# Patient Record
Sex: Female | Born: 1990 | Race: Black or African American | Hispanic: No | Marital: Single | Smoking: Former smoker
Health system: Southern US, Community
[De-identification: ages and names within clinical notes are randomized; demographics above are authoritative.]

## PROBLEM LIST (undated history)

## (undated) ENCOUNTER — Inpatient Hospital Stay (HOSPITAL_COMMUNITY): Payer: Self-pay

## (undated) DIAGNOSIS — R519 Headache, unspecified: Secondary | ICD-10-CM

## (undated) DIAGNOSIS — K219 Gastro-esophageal reflux disease without esophagitis: Secondary | ICD-10-CM

## (undated) DIAGNOSIS — E669 Obesity, unspecified: Secondary | ICD-10-CM

## (undated) DIAGNOSIS — L980 Pyogenic granuloma: Secondary | ICD-10-CM

## (undated) DIAGNOSIS — J45909 Unspecified asthma, uncomplicated: Secondary | ICD-10-CM

## (undated) HISTORY — DX: Obesity, unspecified: E66.9

## (undated) HISTORY — PX: HAND TUMOR EXCISION: SUR568

## (undated) HISTORY — PX: NO PAST SURGERIES: SHX2092

---
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
## 2011-04-23 ENCOUNTER — Emergency Department (HOSPITAL_COMMUNITY): Payer: Medicaid Other

## 2011-04-23 ENCOUNTER — Emergency Department (HOSPITAL_COMMUNITY)
Admission: EM | Admit: 2011-04-23 | Discharge: 2011-04-23 | Disposition: A | Discharge status: Home / Self Care | Payer: Medicaid Other | Attending: Emergency Medicine | Admitting: Emergency Medicine

## 2011-04-23 DIAGNOSIS — R630 Anorexia: Secondary | ICD-10-CM | POA: Insufficient documentation

## 2011-04-23 DIAGNOSIS — Z79899 Other long term (current) drug therapy: Secondary | ICD-10-CM | POA: Insufficient documentation

## 2011-04-23 DIAGNOSIS — R1013 Epigastric pain: Secondary | ICD-10-CM | POA: Insufficient documentation

## 2011-04-23 DIAGNOSIS — N390 Urinary tract infection, site not specified: Secondary | ICD-10-CM | POA: Insufficient documentation

## 2011-04-23 DIAGNOSIS — R6883 Chills (without fever): Secondary | ICD-10-CM | POA: Insufficient documentation

## 2011-04-23 DIAGNOSIS — R112 Nausea with vomiting, unspecified: Secondary | ICD-10-CM | POA: Insufficient documentation

## 2011-04-23 DIAGNOSIS — K219 Gastro-esophageal reflux disease without esophagitis: Secondary | ICD-10-CM | POA: Insufficient documentation

## 2011-04-23 LAB — COMPREHENSIVE METABOLIC PANEL
AST: 20 U/L (ref 0–37)
BUN: 5 mg/dL — ABNORMAL LOW (ref 6–23)
CO2: 23 mEq/L (ref 19–32)
Calcium: 9.2 mg/dL (ref 8.4–10.5)
Chloride: 105 mEq/L (ref 96–112)
Creatinine, Ser: 0.73 mg/dL (ref 0.50–1.10)
GFR calc Af Amer: >60 mL/min (ref >60)
GFR calc non Af Amer: >60 mL/min (ref >60)
Glucose, Bld: 88 mg/dL (ref 70–99)
Total Bilirubin: 0.3 mg/dL (ref 0.3–1.2)

## 2011-04-23 LAB — URINE MICROSCOPIC-ADD ON

## 2011-04-23 LAB — DIFFERENTIAL
Eosinophils Absolute: 0.2 10*3/uL (ref 0.0–0.7)
Lymphocytes Relative: 26 % (ref 12–46)
Lymphs Abs: 2 10*3/uL (ref 0.7–4.0)
Monocytes Relative: 9 % (ref 3–12)
Neutrophils Relative %: 63 % (ref 43–77)

## 2011-04-23 LAB — URINALYSIS, ROUTINE W REFLEX MICROSCOPIC
Nitrite: NEGATIVE
Protein, ur: NEGATIVE mg/dL
Specific Gravity, Urine: 1.019 (ref 1.005–1.030)
Urobilinogen, UA: 1 mg/dL (ref 0.0–1.0)

## 2011-04-23 LAB — LIPASE, BLOOD: Lipase: 26 U/L (ref 11–59)

## 2011-04-23 LAB — CBC
HCT: 37.1 % (ref 36.0–46.0)
MCH: 27.6 pg (ref 26.0–34.0)
MCV: 80.7 fL (ref 78.0–100.0)
Platelets: 334 10*3/uL (ref 150–400)
RBC: 4.6 MIL/uL (ref 3.87–5.11)
WBC: 8 10*3/uL (ref 4.0–10.5)

## 2011-04-23 LAB — POCT PREGNANCY, URINE: Preg Test, Ur: NEGATIVE

## 2011-12-12 IMAGING — CT CT ABD-PELV W/O CM
2 of 4 series · 17 of 46 positions shown, 19 images · non-contrast
Comparison: None.

CLINICAL DATA: Epigastric and suprapubic abdominal pain radiating
to the right back for the past 4 days.  Nausea, vomiting and
diarrhea.  Polyuria.

CT ABDOMEN AND PELVIS WITHOUT CONTRAST
TECHNIQUE: Multidetector CT imaging of the abdomen and pelvis was
performed following the standard protocol without intravenous
contrast.

[Series 2: stone under 200# w/ prev · axial · 0.79mm/px · z∈[-498,-118]mm · 14 of 84 slices shown, 16 images]
[im 4/84  soft-tissue]
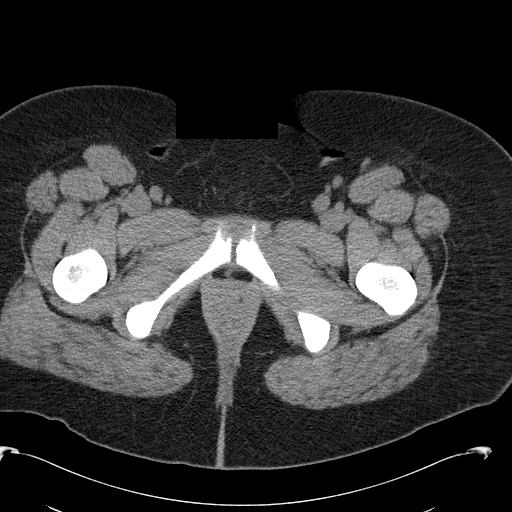
[im 4/84  bone]
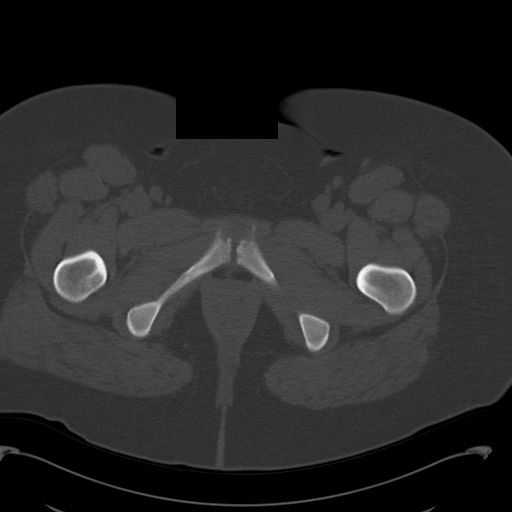
[im 11/84  soft-tissue]
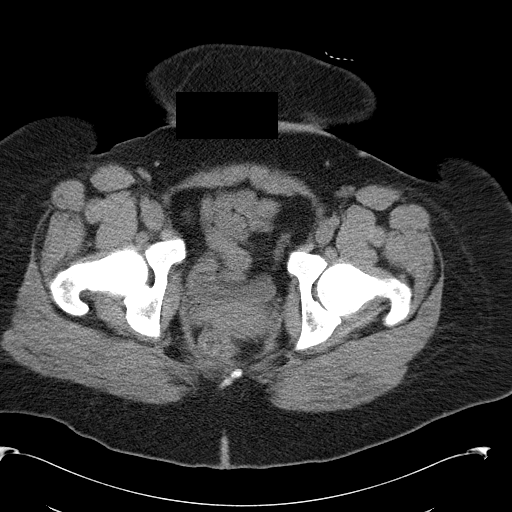
[im 18/84  soft-tissue]
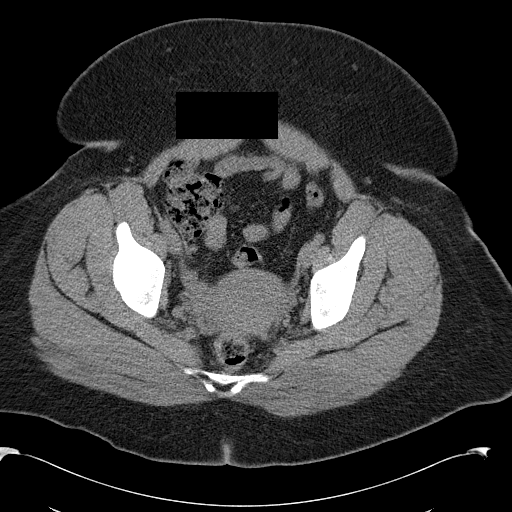
[im 21/84  soft-tissue]
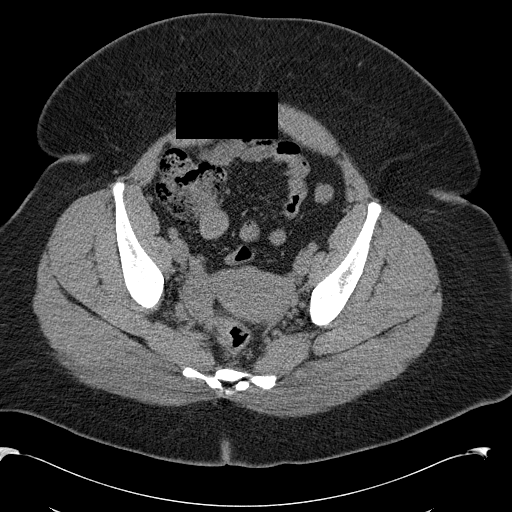
[im 28/84  soft-tissue]
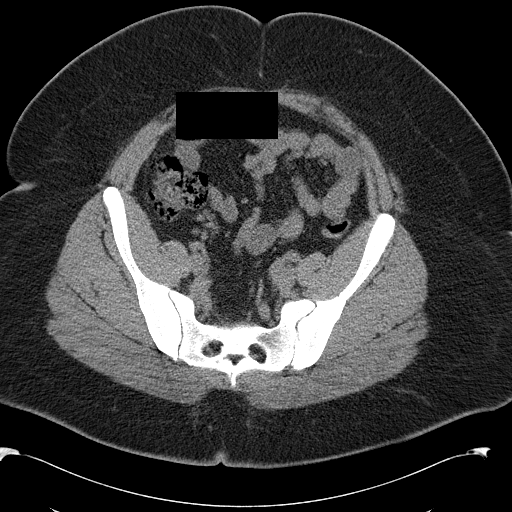
[im 35/84  soft-tissue]
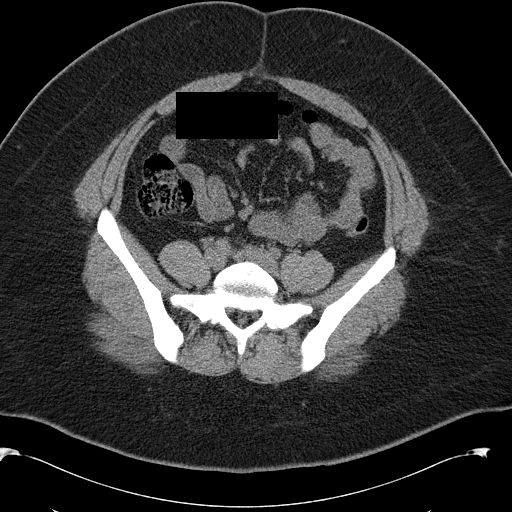
[im 39/84  soft-tissue]
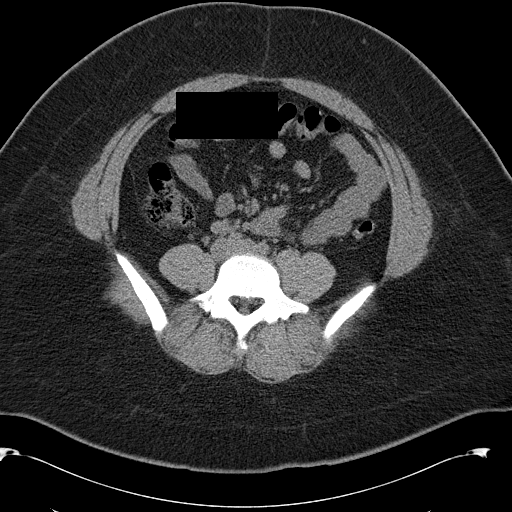
[im 45/84  soft-tissue]
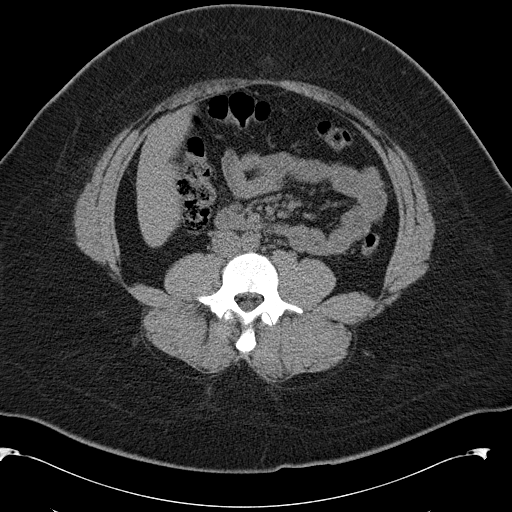
[im 49/84  soft-tissue]
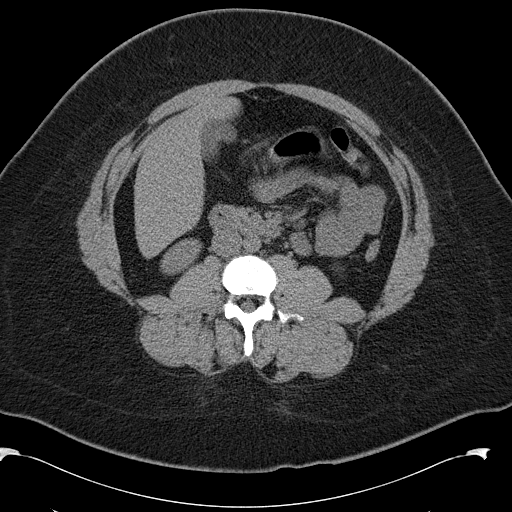
[im 49/84  bone]
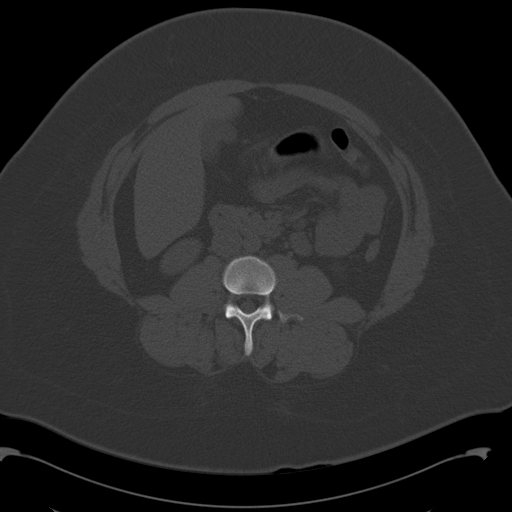
[im 56/84  soft-tissue]
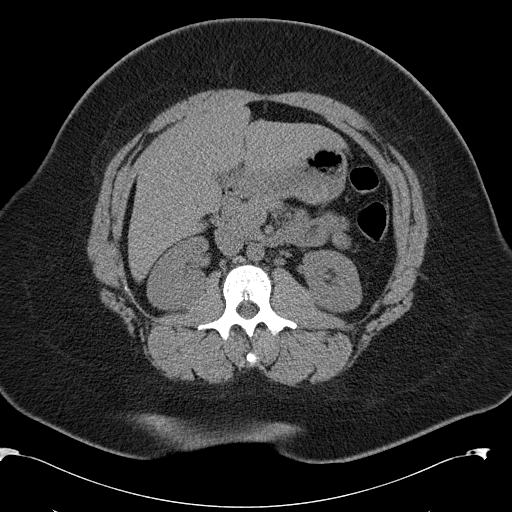
[im 63/84  soft-tissue]
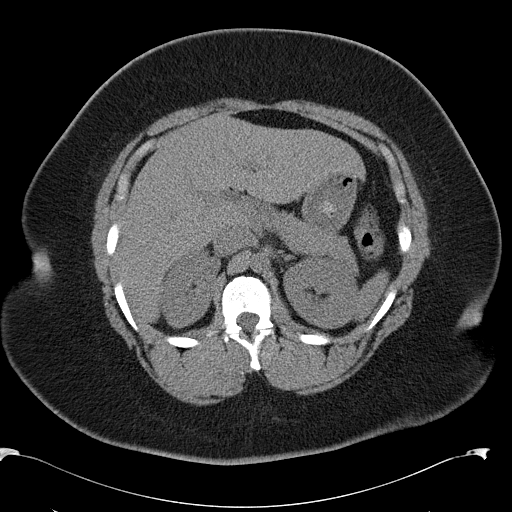
[im 66/84  soft-tissue]
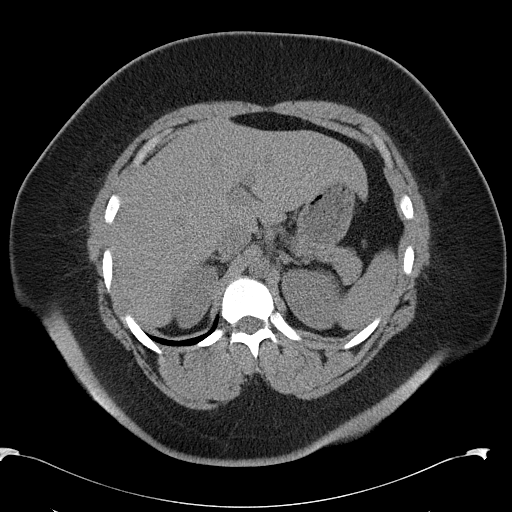
[im 73/84  soft-tissue]
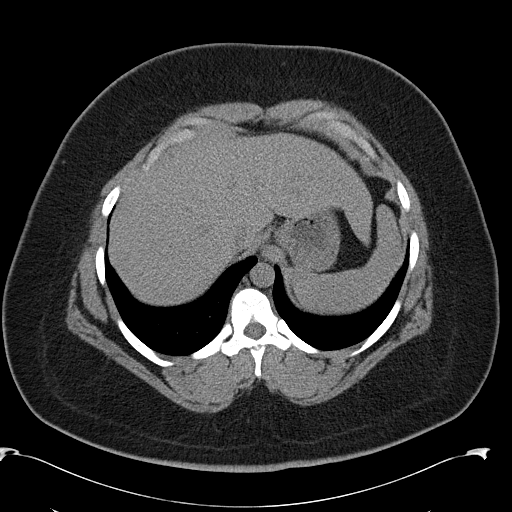
[im 80/84  soft-tissue]
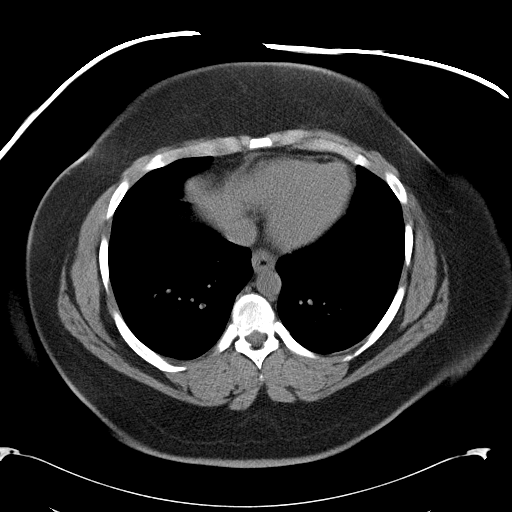

[Series 602: coronal abdomen · coronal · 0.85mm/px · 3 of 172 slices shown]
[im 58/172  soft-tissue]
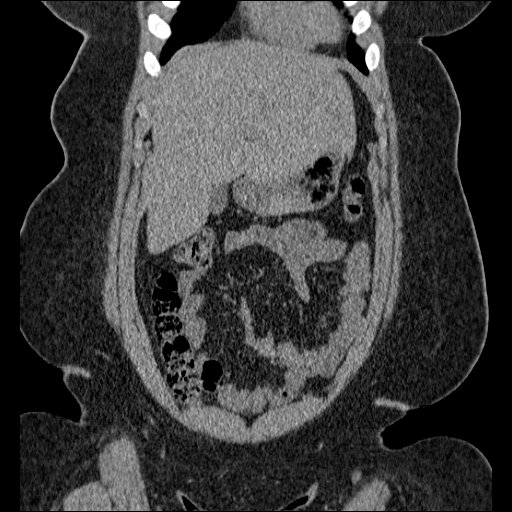
[im 77/172  soft-tissue]
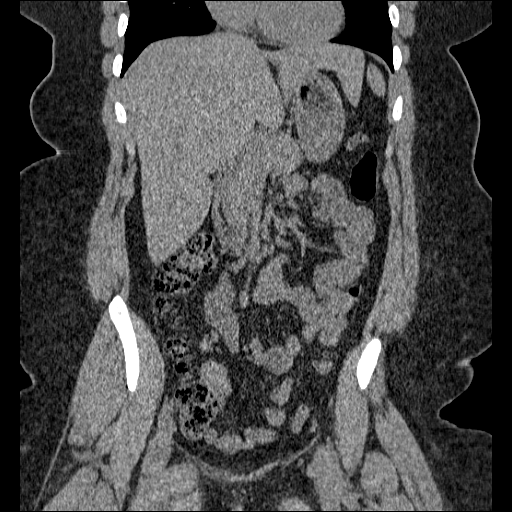
[im 96/172  soft-tissue]
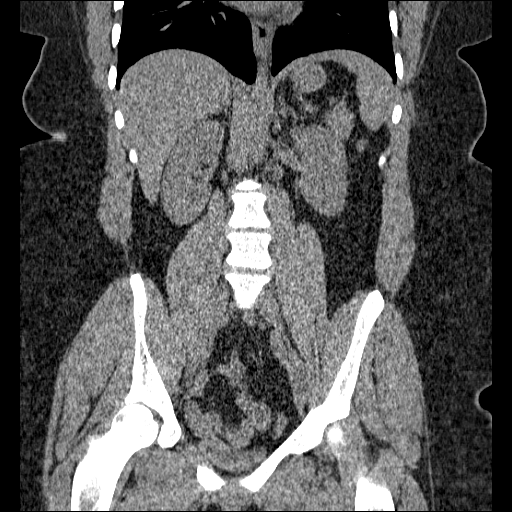

[17 of 46 positions shown; findings below may reference images not displayed]

FINDINGS: Small hiatal hernia or prominent esophageal ampulla.
Clear lung bases.  Normal appearing noncontrasted appearance of the
liver, spleen, pancreas, gallbladder, adrenal glands, kidneys,
urinary bladder, uterus and ovaries.  Mildly prominent mesenteric
lymph nodes with no abnormally enlarged lymph nodes.  No
gastrointestinal abnormalities seen.  No evidence of appendicitis.
No free peritoneal fluid or air.  Normal appearing bones.
IMPRESSION: Small hiatal hernia or prominent esophageal ampulla.  Otherwise,
normal examination.

## 2012-05-02 ENCOUNTER — Encounter (HOSPITAL_COMMUNITY): Payer: Self-pay | Admitting: Emergency Medicine

## 2012-05-02 ENCOUNTER — Emergency Department (HOSPITAL_COMMUNITY)
Admission: EM | Admit: 2012-05-02 | Discharge: 2012-05-03 | Disposition: A | Discharge status: Home / Self Care | Payer: Medicaid Other | Attending: Emergency Medicine | Admitting: Emergency Medicine

## 2012-05-02 DIAGNOSIS — S239XXA Sprain of unspecified parts of thorax, initial encounter: Secondary | ICD-10-CM | POA: Insufficient documentation

## 2012-05-02 DIAGNOSIS — M549 Dorsalgia, unspecified: Secondary | ICD-10-CM

## 2012-05-02 DIAGNOSIS — F17200 Nicotine dependence, unspecified, uncomplicated: Secondary | ICD-10-CM | POA: Insufficient documentation

## 2012-05-02 DIAGNOSIS — T148XXA Other injury of unspecified body region, initial encounter: Secondary | ICD-10-CM

## 2012-05-02 DIAGNOSIS — X58XXXA Exposure to other specified factors, initial encounter: Secondary | ICD-10-CM | POA: Insufficient documentation

## 2012-05-02 DIAGNOSIS — J45909 Unspecified asthma, uncomplicated: Secondary | ICD-10-CM | POA: Insufficient documentation

## 2012-05-02 HISTORY — DX: Unspecified asthma, uncomplicated: J45.909

## 2012-05-02 NOTE — ED Notes (Signed)
Pt states a week ago she was helping her mother move furniture and she fell and since then she has been having pain in her middle of her back  Pt states with certain movement it sends shocklike waves through her back  Pt states she has been taking ibuprofen 800mg  for the past couple days without relief

## 2012-05-03 ENCOUNTER — Emergency Department (HOSPITAL_COMMUNITY): Payer: Medicaid Other

## 2012-05-03 MED ORDER — CYCLOBENZAPRINE HCL 10 MG PO TABS: 10.0000 mg | ORAL_TABLET | Freq: Three times a day (TID) | ORAL | Status: AC | PRN

## 2012-05-03 MED ORDER — KETOROLAC TROMETHAMINE 60 MG/2ML IM SOLN
60.0000 mg | Freq: Once | INTRAMUSCULAR | Status: AC
  Administered 2012-05-03: 60 mg via INTRAMUSCULAR
  Filled 2012-05-03: qty 2

## 2012-05-03 MED ORDER — HYDROCODONE-ACETAMINOPHEN 5-325 MG PO TABS: 1.0000 | ORAL_TABLET | ORAL | Status: AC | PRN

## 2012-05-03 NOTE — ED Notes (Addendum)
Pt c/o itching from the medication, but denies being SOB.

## 2012-05-03 NOTE — ED Provider Notes (Signed)
History     CSN: [id number]  Arrival date & time 05/02/12  2336   First MD Initiated Contact with Patient 05/03/12 0144      Chief Complaint  Patient presents with  . Back Pain   HPI  History provided by the patient. Patient is a 21 year old female with history of asthma who presents with complaints of mid back pain. Patient reports having persistent pains for the past week. Patient states that she was helping her mother move furniture last week and was helping. Couch when she tripped and fell backwards landing directly on her back. Sometimes she has had waxing waning severe pains to the mid back area. Pain is sharp at times and sometimes described as electric shocklike in her back area. Pain does not radiate significantly. She denies any pain in her lower extremities or upper extremities. She denies any numbness or weakness in extremities. Denies any urinary or fecal incontinence, urinary retention or perineal numbness. Patient has been using ibuprofen with minimal relief. Patient also use heating pad with slight improvements. Pain is worse with walking and movements. Denies any chest pain, pleuritic pain or shortness of breath.    Past Medical History  Diagnosis Date  . Asthma   . Recurrent sinus infections     History reviewed. No pertinent past surgical history.  Family History  Problem Relation Age of Onset  . Hypertension Other   . Cancer Other     History  Substance Use Topics  . Smoking status: Current Everyday Smoker    Types: Cigarettes  . Smokeless tobacco: Not on file  . Alcohol Use: No    OB History    Grav Para Term Preterm Abortions TAB SAB Ect Mult Living                  Review of Systems  HENT: Negative for neck pain.   Respiratory: Negative for shortness of breath.   Cardiovascular: Negative for chest pain.  Gastrointestinal: Negative for nausea, vomiting and abdominal pain.  Genitourinary: Negative for dysuria, frequency, hematuria and flank  pain.  Musculoskeletal: Negative for back pain.  Neurological: Negative for weakness, numbness and headaches.    Allergies  Review of patient's allergies indicates no known allergies.  Home Medications   Current Outpatient Rx  Name Route Sig Dispense Refill  . IBUPROFEN 800 MG PO TABS Oral Take 800 mg by mouth every 8 (eight) hours as needed. Back pain    . NORETHINDRON-ETHINYL ESTRAD-FE 1-20/1-30/1-35 MG-MCG PO TABS Oral Take 1 tablet by mouth daily.      BP 110/65  Pulse 89  Temp 98.3 F (36.8 C) (Oral)  Resp 18  Ht 5\' 4"  (1.626 m)  SpO2 99%  LMP 05/01/2012  Physical Exam  Nursing note and vitals reviewed. Constitutional: She is oriented to person, place, and time. She appears well-developed and well-nourished. No distress.       Obese  HENT:  Head: Normocephalic and atraumatic.  Neck: Normal range of motion. Neck supple.  Cardiovascular: Normal rate and regular rhythm.   Pulmonary/Chest: Effort normal and breath sounds normal.  Musculoskeletal:       Cervical back: Normal.       Thoracic back: She exhibits tenderness.       Lumbar back: Normal.       Back:  Neurological: She is alert and oriented to person, place, and time. She has normal strength. No sensory deficit. Gait normal.  Skin: Skin is warm and dry. No erythema.  Psychiatric: She has a normal mood and affect. Her behavior is normal.    ED Course  Procedures   Dg Thoracic Spine 2 View  05/03/2012  *RADIOLOGY REPORT*  Clinical Data: Back pain.  THORACIC SPINE - 2 VIEW  Comparison: None.  Findings: There is a dextroconvex curvature of the thoracolumbar junction.  Vertebral body height preserved.  Paraspinal lines appear within normal limits.  No compression fractures identified. Cervicothoracic junction appears within normal limits.  IMPRESSION: No acute osseous abnormality.  Original Report Authenticated By: Andreas Newport, M.D.     1. Muscle strain   2. Back pain       MDM  1:50 AM patient seen  and evaluated.   X-rays unremarkable. At this time suspect muscle strain. Patient given prescriptions for muscle relaxer.     Angus Seller, [place] 05/03/12 2139

## 2012-05-03 NOTE — ED Notes (Signed)
Pt reports mid back pain for a week. Pt reports taking 800 Ibuprofen but to no relief. Pt reports helping her mom move and was lifting heavy furniture a week ago.

## 2012-05-04 NOTE — ED Provider Notes (Signed)
Medical screening examination/treatment/procedure(s) were performed by non-physician practitioner and as supervising physician I was immediately available for consultation/collaboration.  Kelin Borum M Briar Witherspoon, MD 05/04/12 0715 

## 2012-06-23 ENCOUNTER — Emergency Department (HOSPITAL_COMMUNITY)
Admission: EM | Admit: 2012-06-23 | Discharge: 2012-06-24 | Disposition: A | Discharge status: Home / Self Care | Payer: Medicaid Other | Attending: Emergency Medicine | Admitting: Emergency Medicine

## 2012-06-23 ENCOUNTER — Emergency Department (HOSPITAL_COMMUNITY): Payer: Medicaid Other

## 2012-06-23 ENCOUNTER — Encounter (HOSPITAL_COMMUNITY): Payer: Self-pay

## 2012-06-23 DIAGNOSIS — J329 Chronic sinusitis, unspecified: Secondary | ICD-10-CM | POA: Insufficient documentation

## 2012-06-23 DIAGNOSIS — F17200 Nicotine dependence, unspecified, uncomplicated: Secondary | ICD-10-CM | POA: Insufficient documentation

## 2012-06-23 DIAGNOSIS — J45909 Unspecified asthma, uncomplicated: Secondary | ICD-10-CM | POA: Insufficient documentation

## 2012-06-23 DIAGNOSIS — J069 Acute upper respiratory infection, unspecified: Secondary | ICD-10-CM | POA: Insufficient documentation

## 2012-06-23 NOTE — ED Notes (Signed)
Per pt started having n/v, headache, cough x 2 days ago.  No fevers noted at home.  Pt states coughing yellow phlegm.  Runny nose.

## 2012-06-24 LAB — CBC WITH DIFFERENTIAL/PLATELET
Eosinophils Absolute: 0.2 10*3/uL (ref 0.0–0.7)
Eosinophils Relative: 2 % (ref 0–5)
HCT: 40.7 % (ref 36.0–46.0)
Hemoglobin: 14 g/dL (ref 12.0–15.0)
Lymphs Abs: 3.2 10*3/uL (ref 0.7–4.0)
MCH: 28.5 pg (ref 26.0–34.0)
MCV: 82.9 fL (ref 78.0–100.0)
Monocytes Absolute: 1 10*3/uL (ref 0.1–1.0)
Monocytes Relative: 9 % (ref 3–12)
Platelets: 263 10*3/uL (ref 150–400)
RBC: 4.91 MIL/uL (ref 3.87–5.11)

## 2012-06-24 LAB — URINALYSIS, ROUTINE W REFLEX MICROSCOPIC
Bilirubin Urine: NEGATIVE
Glucose, UA: NEGATIVE mg/dL
Hgb urine dipstick: NEGATIVE
Specific Gravity, Urine: 1.031 — ABNORMAL HIGH (ref 1.005–1.030)
Urobilinogen, UA: 1 mg/dL (ref 0.0–1.0)

## 2012-06-24 LAB — COMPREHENSIVE METABOLIC PANEL
BUN: 14 mg/dL (ref 6–23)
CO2: 26 mEq/L (ref 19–32)
Calcium: 9.3 mg/dL (ref 8.4–10.5)
Creatinine, Ser: 0.93 mg/dL (ref 0.50–1.10)
GFR calc Af Amer: >90 mL/min (ref >90)
GFR calc non Af Amer: 88 mL/min — ABNORMAL LOW (ref >90)
Glucose, Bld: 87 mg/dL (ref 70–99)
Total Protein: 7.2 g/dL (ref 6.0–8.3)

## 2012-06-24 LAB — URINE MICROSCOPIC-ADD ON

## 2012-06-24 MED ORDER — HYDROCODONE-ACETAMINOPHEN 7.5-500 MG/15ML PO SOLN: 15.0000 mL | Freq: Four times a day (QID) | ORAL | Status: DC | PRN

## 2012-06-24 MED ORDER — HYDROCODONE-ACETAMINOPHEN 7.5-500 MG/15ML PO SOLN
15.0000 mL | Freq: Once | ORAL | Status: AC
  Administered 2012-06-24: 15 mL via ORAL
  Filled 2012-06-24: qty 15

## 2012-06-24 MED ORDER — GUAIFENESIN 100 MG/5ML PO LIQD: 100.0000 mg | ORAL | Status: DC | PRN

## 2012-06-24 NOTE — ED Provider Notes (Signed)
History     CSN: [id number]  Arrival date & time 06/23/12  2221   First MD Initiated Contact with Patient 06/24/12 0023      Chief Complaint  Patient presents with  . Emesis  . Cough  . Headache    (Consider location/radiation/quality/duration/timing/severity/associated sxs/prior treatment) HPI  21 year old female presents with URI sxs.  Patient reports for the past 2 days she has had cold symptoms including, sinus congestion, sneezing, coughing, sore throat, chest congestion, and body aches.  She is coughing up yellow phlegm.  Onset is gradual, persistent, and worsening.  Has postussive emesis.  She denies fever, chills, cp, sob, hemoptysis, abd pain, urinary sxs or rash.  She is a smoker.  Has hx of asthma but has not need to use inhaler.    Past Medical History  Diagnosis Date  . Asthma   . Recurrent sinus infections     History reviewed. No pertinent past surgical history.  Family History  Problem Relation Age of Onset  . Hypertension Other   . Cancer Other     History  Substance Use Topics  . Smoking status: Current Every Day Smoker    Types: Cigarettes  . Smokeless tobacco: Not on file  . Alcohol Use: No    OB History    Grav Para Term Preterm Abortions TAB SAB Ect Mult Living                  Review of Systems  All other systems reviewed and are negative.    Allergies  Review of patient's allergies indicates no known allergies.  Home Medications   Current Outpatient Rx  Name Route Sig Dispense Refill  . LORATADINE 10 MG PO TABS Oral Take 10 mg by mouth daily.      BP 120/72  Pulse 99  Temp 98.9 F (37.2 C) (Oral)  Resp 20  SpO2 100%  LMP 05/25/2012  Physical Exam  Nursing note and vitals reviewed. Constitutional: She appears well-developed and well-nourished.       Persistent cough, and appears uncomfortable  HENT:  Head: Normocephalic and atraumatic.  Right Ear: External ear normal.  Left Ear: External ear normal.       Mild post  oropharyngeal erythema without signs of deep infection such as PTA or Ludwig's angina.  Uvula midline  Eyes: Conjunctivae normal are normal. Right eye exhibits no discharge. Left eye exhibits no discharge.  Neck: Normal range of motion. Neck supple.  Cardiovascular: Normal rate and regular rhythm.   Pulmonary/Chest: Effort normal and breath sounds normal. No respiratory distress. She exhibits no tenderness.  Abdominal: Soft. Bowel sounds are normal. There is no tenderness.  Lymphadenopathy:    She has no cervical adenopathy.  Neurological: She is alert.  Skin: Skin is warm. No rash noted.  Psychiatric: She has a normal mood and affect.    ED Course  Procedures (including critical care time)  Labs Reviewed - No data to display Dg Chest 2 View  06/23/2012  *RADIOLOGY REPORT*  Clinical Data: Cough and headache.  CHEST - 2 VIEW  Comparison: None.  Findings: The lungs are clear without focal consolidation, edema, effusion or pneumothorax.  Cardiopericardial silhouette is within normal limits for size.  Imaged bony structures of the thorax are intact.  IMPRESSION: Normal cardiopulmonary exam.   Original Report Authenticated By: ERIC A. MANSELL, M.D.    1. URI  MDM  Pt presents with URI sxs.  She is afebrile, cxr neg for pna.  VSS.  Will give lortab elixir for cough and pain.  Reassurance given.  Care instruction given.     BP 120/72  Pulse 99  Temp 98.9 F (37.2 C) (Oral)  Resp 20  SpO2 100%  LMP 05/25/2012  Nursing notes reviewed and considered in documentation  Previous records reviewed and considered  All labs/vitals reviewed and considered  xrays reviewed and considered      Fayrene Helper, PA-C 06/24/12 0105

## 2012-06-24 NOTE — ED Provider Notes (Signed)
Medical screening examination/treatment/procedure(s) were performed by non-physician practitioner and as supervising physician I was immediately available for consultation/collaboration. Katharine Rochefort, MD, FACEP   Raden Byington L Donnielle Addison, MD 06/24/12 0114 

## 2012-09-01 IMAGING — US US ABDOMEN COMPLETE
1 series · 14 of 25 positions shown · non-contrast
Comparison: None.

CLINICAL DATA: Abdominal pain

ABDOMINAL ULTRASOUND COMPLETE

[Series 1: us abdomen complete · 0.31mm/px · 14 of 77 slices shown]
[im 1/77]
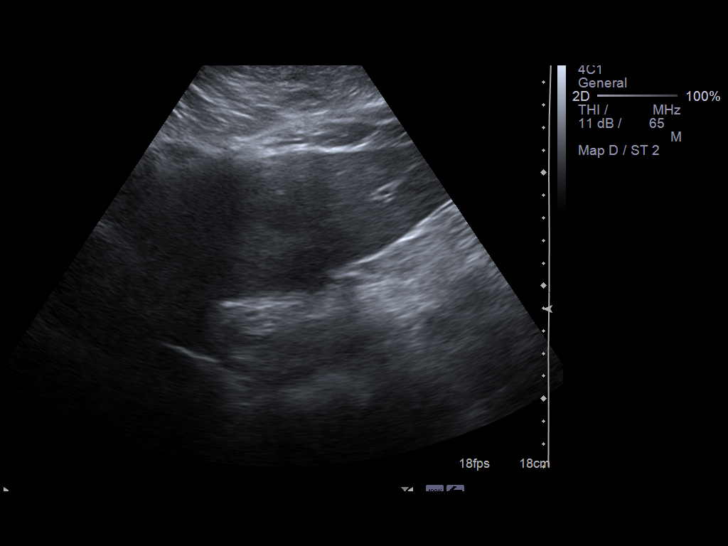
[im 7/77]
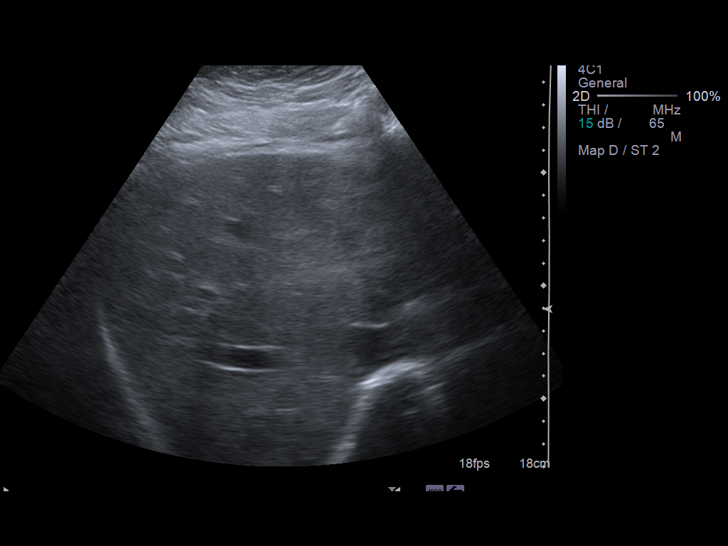
[im 13/77]
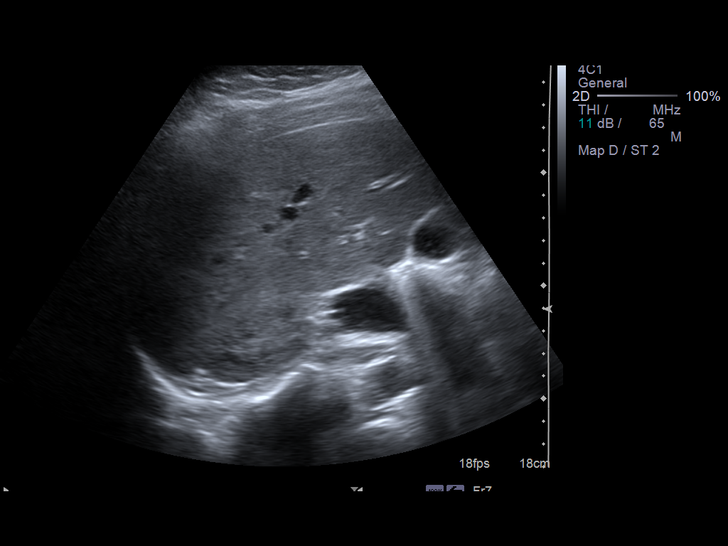
[im 20/77]
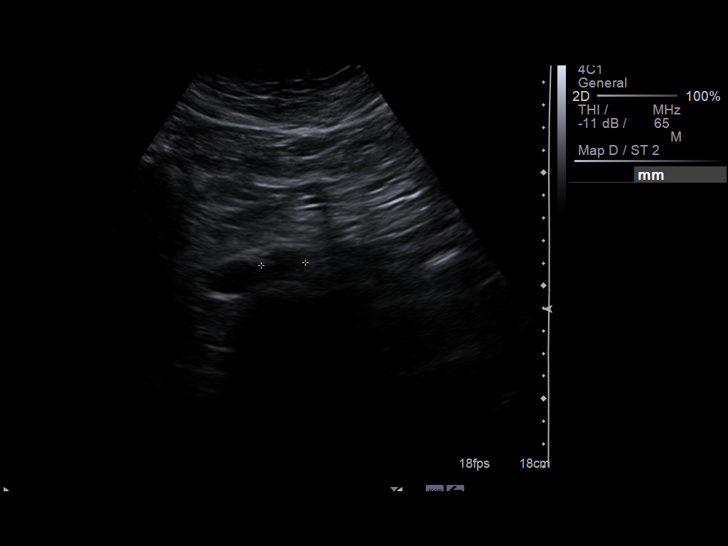
[im 26/77]
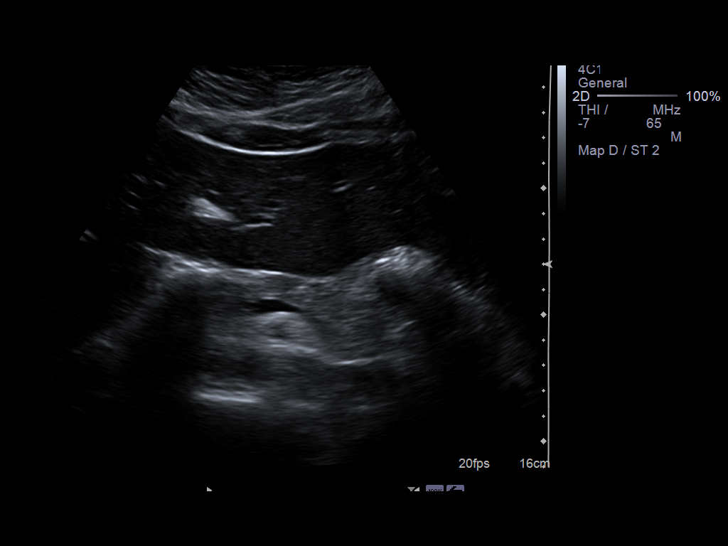
[im 29/77]
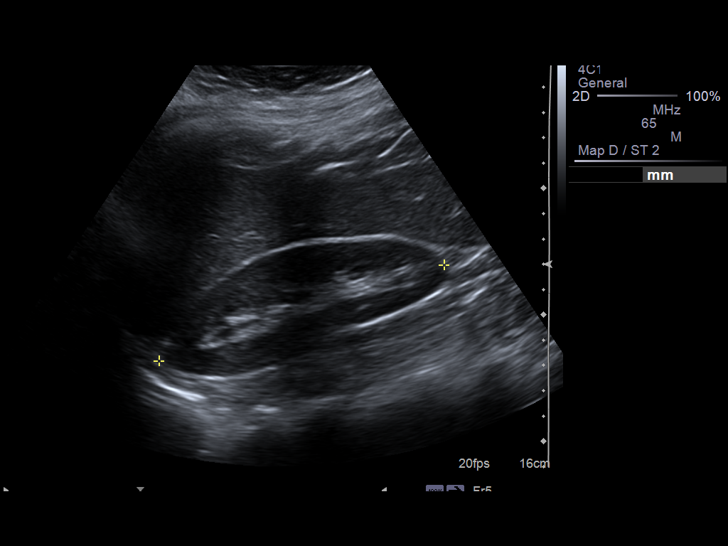
[im 35/77]
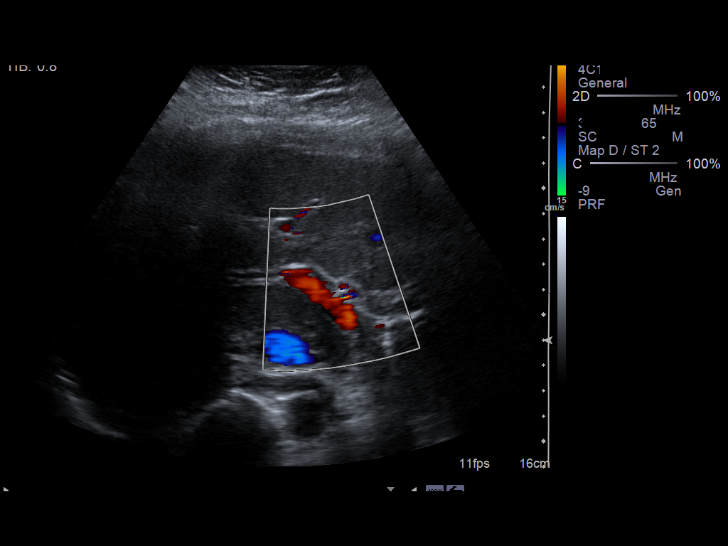
[im 42/77]
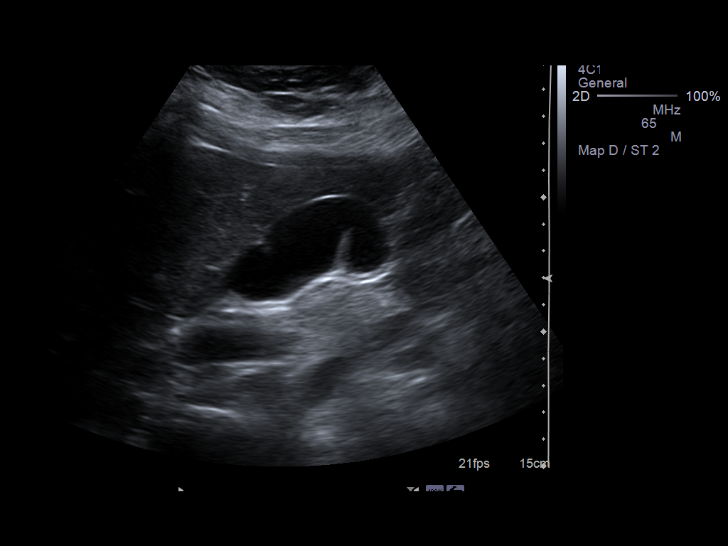
[im 48/77]
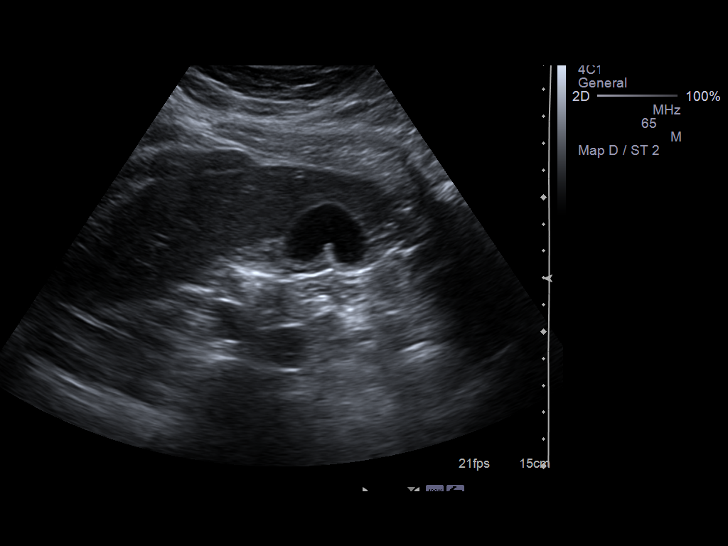
[im 51/77]
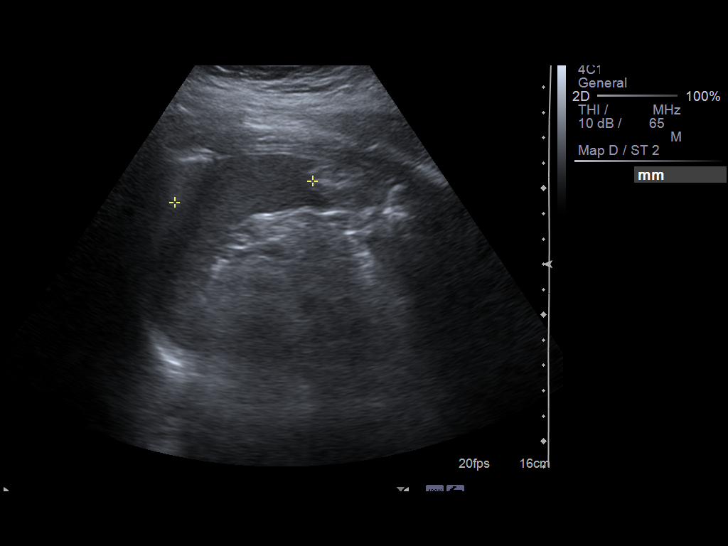
[im 58/77]
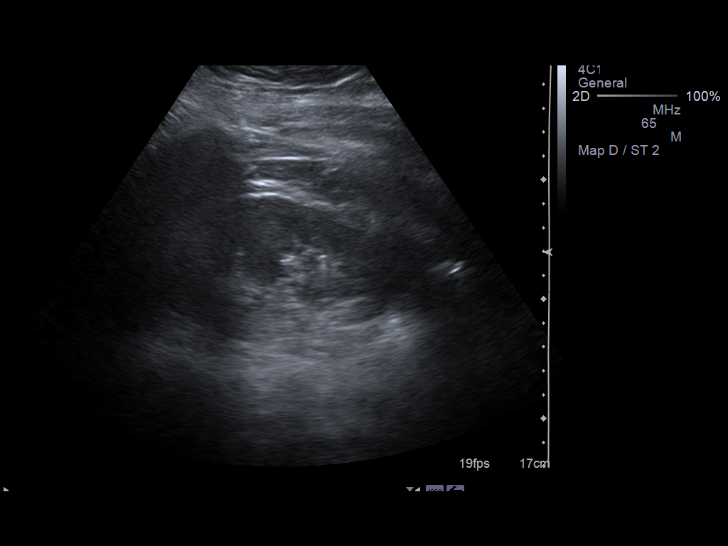
[im 64/77]
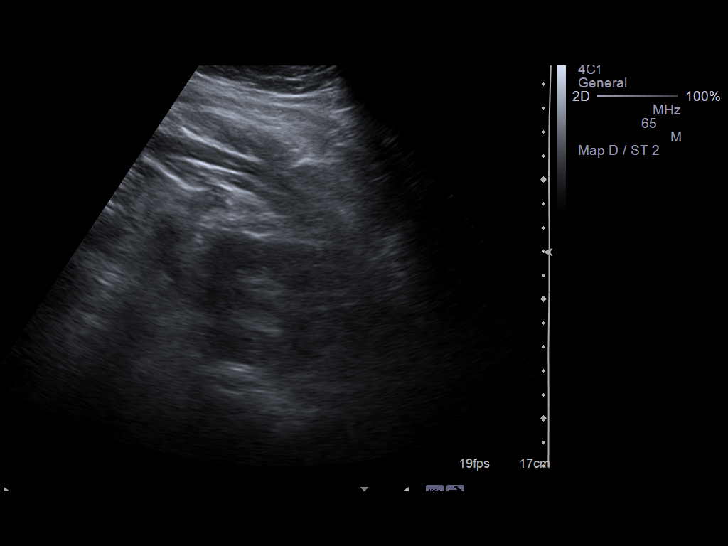
[im 70/77]
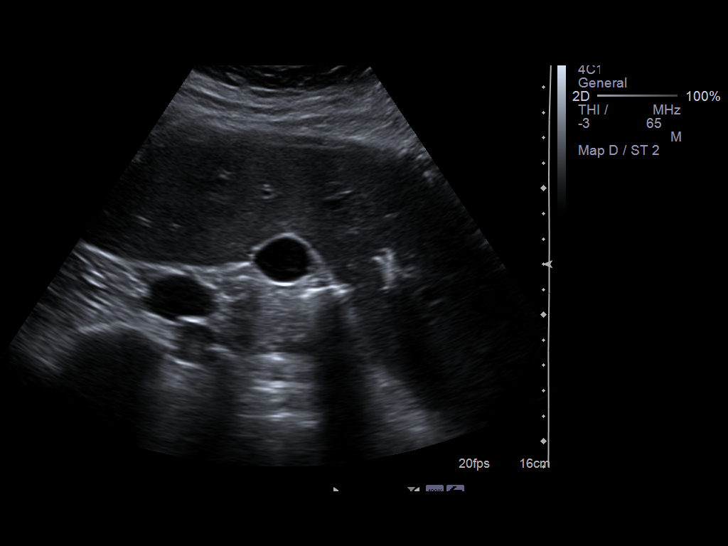
[im 77/77]
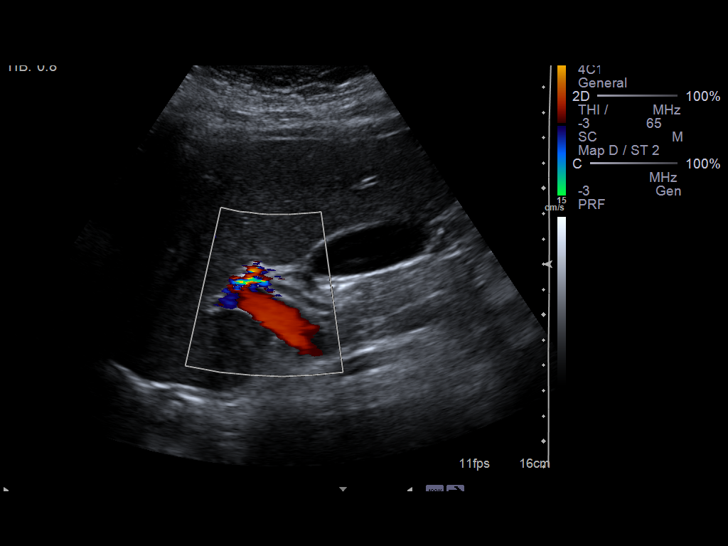

[14 of 25 positions shown; findings below may reference images not displayed]

FINDINGS: Gallbladder:  No gallstones, gallbladder wall thickening, or
pericholecystic fluid.

Common Bile Duct:  Within normal limits in caliber.

Liver: No focal mass lesion identified.  Within normal limits in
parenchymal echogenicity.

IVC:  Appears normal.

Pancreas:  No abnormality identified.

Spleen:  Within normal limits in size and echotexture.

Right kidney:  Normal in size and parenchymal echogenicity.  No
evidence of mass or hydronephrosis.

Left kidney:  Normal in size and parenchymal echogenicity.  No
evidence of mass or hydronephrosis.

Abdominal Aorta:  No aneurysm identified.
IMPRESSION: Negative abdominal ultrasound.

## 2012-09-21 NOTE — L&D Delivery Note (Signed)
Operative Delivery Note At 3:52 PM a viable female was delivered via Vaginal, Spontaneous Delivery.    Verbal consent: obtained from patient.  Risks and benefits discussed in detail.  Risks include, but are not limited to the risks of anesthesia, bleeding, infection, damage to maternal tissues, fetal cephalhematoma.  There is also the risk of inability to effect vaginal delivery of the head, or shoulder dystocia that cannot be resolved by established maneuvers, leading to the need for emergency cesarean section.  APGAR: 9, 9; weight 8 lb 14.9 oz (4051 g).   Placenta status: Intact, Spontaneous.   Cord: 3 vessels with the following complications: None.    Anesthesia: Epidural  Instruments: KIWI vacuum Episiotomy: None Lacerations: 2nd degree;Perineal Suture Repair: 3.0 vicryl Est. Blood Loss (mL): 400  Mom to postpartum.  Baby to nursery-stable.  Called to pt room for maternal exhaustion after pushing for >2 hours. Risks of vacuum discussed as above. Pt pushed to deliver a newborn female via VAVD. No popoffs. Spontaneous cry.  2nd degree perineal repaired with 3-0 vicryl. No complications. Placenta delivered spontaneously intact.   Dr. Marice Potter present for delivery  Leslie Mayer 05/20/2013, 4:01 AM

## 2012-10-05 ENCOUNTER — Encounter (HOSPITAL_COMMUNITY): Payer: Self-pay | Admitting: *Deleted

## 2012-10-05 ENCOUNTER — Inpatient Hospital Stay (HOSPITAL_COMMUNITY)
Admission: AD | Admit: 2012-10-05 | Discharge: 2012-10-05 | Disposition: A | Discharge status: Home / Self Care | Payer: Medicaid Other | Source: Ambulatory Visit | Attending: Obstetrics & Gynecology | Admitting: Obstetrics & Gynecology

## 2012-10-05 DIAGNOSIS — O210 Mild hyperemesis gravidarum: Secondary | ICD-10-CM

## 2012-10-05 DIAGNOSIS — O219 Vomiting of pregnancy, unspecified: Secondary | ICD-10-CM

## 2012-10-05 LAB — URINALYSIS, ROUTINE W REFLEX MICROSCOPIC
Glucose, UA: NEGATIVE mg/dL
Leukocytes, UA: NEGATIVE
Protein, ur: NEGATIVE mg/dL
Specific Gravity, Urine: >1.03 — ABNORMAL HIGH (ref 1.005–1.030)

## 2012-10-05 LAB — POCT PREGNANCY, URINE: Preg Test, Ur: POSITIVE — AB

## 2012-10-05 MED ORDER — DOXYLAMINE-PYRIDOXINE 10-10 MG PO TBEC: 1.0000 | DELAYED_RELEASE_TABLET | Freq: Two times a day (BID) | ORAL | Status: DC

## 2012-10-05 MED ORDER — ONDANSETRON 8 MG PO TBDP
8.0000 mg | ORAL_TABLET | Freq: Once | ORAL | Status: AC
  Administered 2012-10-05: 8 mg via ORAL
  Filled 2012-10-05: qty 1

## 2012-10-05 MED ORDER — ONDANSETRON 8 MG PO TBDP: 8.0000 mg | ORAL_TABLET | Freq: Three times a day (TID) | ORAL | Status: DC | PRN

## 2012-10-05 NOTE — MAU Provider Note (Signed)
History     CSN: [id number]  Arrival date and time: 10/05/12 1133   None     Chief Complaint  Patient presents with  . Nausea  . Emesis  . Diarrhea   HPI 22 y.o. G1P0 at [redacted]w[redacted]d with n/v x 3 days, loose stool x 3 today. No fever/chills. Patient's last menstrual period was 08/20/2012.  Past Medical History  Diagnosis Date  . Asthma   . Recurrent sinus infections   . Heart murmur     Past Surgical History  Procedure Date  . No past surgeries     Family History  Problem Relation Age of Onset  . Hypertension Other   . Cancer Other     History  Substance Use Topics  . Smoking status: Current Every Day Smoker -- 1.0 packs/day    Types: Cigarettes  . Smokeless tobacco: Never Used  . Alcohol Use: No    Allergies: No Known Allergies  No prescriptions prior to admission    Review of Systems  Constitutional: Negative.   Respiratory: Negative.   Cardiovascular: Negative.   Gastrointestinal: Negative for nausea, vomiting, abdominal pain, diarrhea and constipation.  Genitourinary: Negative for dysuria, urgency, frequency, hematuria and flank pain.       Negative for vaginal bleeding, vaginal discharge  Musculoskeletal: Negative.   Neurological: Negative.   Psychiatric/Behavioral: Negative.    Physical Exam   Blood pressure 117/67, pulse 80, temperature 97 F (36.1 C), temperature source Oral, resp. rate 18, height 5\' 4"  (1.626 m), weight 283 lb (128.368 kg), last menstrual period 08/20/2012.  Physical Exam  Nursing note and vitals reviewed. Constitutional: She is oriented to person, place, and time. She appears well-developed and well-nourished. No distress.  Cardiovascular: Normal rate.   Respiratory: Effort normal.  GI: Soft. She exhibits no distension and no mass. There is no tenderness. There is no rebound and no guarding.  Musculoskeletal: Normal range of motion.  Neurological: She is alert and oriented to person, place, and time.  Skin: Skin is warm and  dry.  Psychiatric: She has a normal mood and affect.    MAU Course  Procedures  Results for orders placed during the hospital encounter of 10/05/12 (from the past 24 hour(s))  URINALYSIS, ROUTINE W REFLEX MICROSCOPIC     Status: Abnormal   Collection Time   10/05/12 12:29 PM      Component Value Range   Color, Urine YELLOW  YELLOW   APPearance CLEAR  CLEAR   Specific Gravity, Urine >1.030 (*) 1.005 - 1.030   pH 6.0  5.0 - 8.0   Glucose, UA NEGATIVE  NEGATIVE mg/dL   Hgb urine dipstick NEGATIVE  NEGATIVE   Bilirubin Urine NEGATIVE  NEGATIVE   Ketones, ur NEGATIVE  NEGATIVE mg/dL   Protein, ur NEGATIVE  NEGATIVE mg/dL   Urobilinogen, UA 0.2  0.0 - 1.0 mg/dL   Nitrite NEGATIVE  NEGATIVE   Leukocytes, UA NEGATIVE  NEGATIVE  POCT PREGNANCY, URINE     Status: Abnormal   Collection Time   10/05/12 12:39 PM      Component Value Range   Preg Test, Ur POSITIVE (*) NEGATIVE     Assessment and Plan   1. Nausea and vomiting in pregnancy       Medication List     As of 10/05/2012  2:37 PM    START taking these medications         Doxylamine-Pyridoxine 10-10 MG Tbec   Take 1 tablet by mouth 2 (two) times  daily.      ondansetron 8 MG disintegrating tablet   Commonly known as: ZOFRAN-ODT   Take 1 tablet (8 mg total) by mouth every 8 (eight) hours as needed for nausea.      CONTINUE taking these medications         loratadine 10 MG tablet   Commonly known as: CLARITIN          Where to get your medications    These are the prescriptions that you need to pick up. We sent them to a specific pharmacy, so you will need to go there to get them.   [facility] [address] [place], [place] - [address]    [address] [place] [address]    Phone: [phone]        Doxylamine-Pyridoxine 10-10 MG Tbec   ondansetron 8 MG disintegrating tablet            Follow-up Information    Follow up with [facility] [place]. (start prenatal care as  soon as possible)    Contact information:   [address] [place] [address] [phone]           Meredyth Hornung 10/05/2012, 2:36 PM

## 2012-10-05 NOTE — MAU Note (Signed)
N/v/d/ started 3 days ago. Had pos home test 2-3wks ago.  3 bms today/watery.  Everything she eats comes back up.

## 2012-10-06 NOTE — MAU Provider Note (Signed)
Attestation of Attending Supervision of Advanced Practitioner (PA/CNM/NP): Evaluation and management procedures were performed by the Advanced Practitioner under my supervision and collaboration.  I have reviewed the Advanced Practitioner's note and chart, and I agree with the management and plan.  Cranford Blessinger, MD, FACOG Attending Obstetrician & Gynecologist Faculty Practice, [facility]  

## 2012-10-23 ENCOUNTER — Encounter (HOSPITAL_COMMUNITY): Payer: Self-pay

## 2012-10-23 ENCOUNTER — Inpatient Hospital Stay (HOSPITAL_COMMUNITY)
Admission: AD | Admit: 2012-10-23 | Discharge: 2012-10-23 | Disposition: A | Discharge status: Home / Self Care | Payer: Medicaid Other | Source: Ambulatory Visit | Attending: Obstetrics & Gynecology | Admitting: Obstetrics & Gynecology

## 2012-10-23 DIAGNOSIS — O98819 Other maternal infectious and parasitic diseases complicating pregnancy, unspecified trimester: Secondary | ICD-10-CM | POA: Insufficient documentation

## 2012-10-23 DIAGNOSIS — A5901 Trichomonal vulvovaginitis: Secondary | ICD-10-CM | POA: Insufficient documentation

## 2012-10-23 DIAGNOSIS — R197 Diarrhea, unspecified: Secondary | ICD-10-CM | POA: Insufficient documentation

## 2012-10-23 DIAGNOSIS — O210 Mild hyperemesis gravidarum: Secondary | ICD-10-CM | POA: Insufficient documentation

## 2012-10-23 DIAGNOSIS — O219 Vomiting of pregnancy, unspecified: Secondary | ICD-10-CM

## 2012-10-23 LAB — URINALYSIS, ROUTINE W REFLEX MICROSCOPIC
Bilirubin Urine: NEGATIVE
Ketones, ur: 15 mg/dL — AB
Nitrite: NEGATIVE
Specific Gravity, Urine: >1.03 — ABNORMAL HIGH (ref 1.005–1.030)
Urobilinogen, UA: 0.2 mg/dL (ref 0.0–1.0)

## 2012-10-23 LAB — WET PREP, GENITAL

## 2012-10-23 MED ORDER — LACTATED RINGERS IV BOLUS (SEPSIS)
1000.0000 mL | Freq: Once | INTRAVENOUS | Status: AC
  Administered 2012-10-23: 1000 mL via INTRAVENOUS

## 2012-10-23 MED ORDER — PROMETHAZINE HCL 25 MG PO TABS: ORAL_TABLET | ORAL | Status: DC

## 2012-10-23 MED ORDER — METRONIDAZOLE 500 MG PO TABS
2000.0000 mg | ORAL_TABLET | Freq: Once | ORAL | Status: AC
  Administered 2012-10-23: 2000 mg via ORAL
  Filled 2012-10-23: qty 4

## 2012-10-23 MED ORDER — PROMETHAZINE HCL 25 MG/ML IJ SOLN
25.0000 mg | Freq: Once | INTRAMUSCULAR | Status: AC
  Administered 2012-10-23: 25 mg via INTRAMUSCULAR
  Filled 2012-10-23: qty 1

## 2012-10-23 MED ORDER — ONDANSETRON 8 MG PO TBDP
8.0000 mg | ORAL_TABLET | Freq: Once | ORAL | Status: AC
  Administered 2012-10-23: 8 mg via ORAL
  Filled 2012-10-23: qty 1

## 2012-10-23 NOTE — MAU Provider Note (Signed)
History     CSN: [id number]  Arrival date and time: 10/23/12 0201   First Provider Initiated Contact with Patient 10/23/12 0236      No chief complaint on file.  HPI  Leslie Mayer is a 22 y.o. G1P0000 at [redacted]w[redacted]d who presents today with nausea/vomiting and diarrhea. She states that it has been going on for 2-3 days. She was seen in MAU for this same problem last week. She has been taking the Zofran as prescribed, but it is not helping. She has not started prenatal care at this time. She also states that she is having abdominal pain, and points to the area around the umbilicus.   Past Medical History  Diagnosis Date  . Asthma   . Recurrent sinus infections   . Heart murmur     Past Surgical History  Procedure Date  . No past surgeries     Family History  Problem Relation Age of Onset  . Hypertension Other   . Cancer Other     History  Substance Use Topics  . Smoking status: Current Every Day Smoker -- 1.0 packs/day    Types: Cigarettes  . Smokeless tobacco: Never Used  . Alcohol Use: No    Allergies: No Known Allergies  Prescriptions prior to admission  Medication Sig Dispense Refill  . albuterol (PROVENTIL HFA;VENTOLIN HFA) 108 (90 BASE) MCG/ACT inhaler Inhale 2 puffs into the lungs every 6 (six) hours as needed.      . ondansetron (ZOFRAN ODT) 8 MG disintegrating tablet Take 1 tablet (8 mg total) by mouth every 8 (eight) hours as needed for nausea.  20 tablet  0  . Doxylamine-Pyridoxine 10-10 MG TBEC Take 1 tablet by mouth 2 (two) times daily.  60 tablet  1  . loratadine (CLARITIN) 10 MG tablet Take 10 mg by mouth daily.        Review of Systems  Constitutional: Negative for fever, chills and weight loss.  Respiratory: Negative for cough.   Gastrointestinal: Positive for nausea, vomiting, abdominal pain (see HPI) and diarrhea. Negative for constipation and blood in stool.  Genitourinary: Negative for dysuria, urgency and frequency.  Musculoskeletal: Negative  for myalgias.  Neurological: Negative for dizziness and headaches.   Physical Exam   Blood pressure 117/64, pulse 92, temperature 97.5 F (36.4 C), temperature source Oral, resp. rate 18, height 5\' 4"  (1.626 m), weight 290 lb (131.543 kg), last menstrual period 08/20/2012.  Physical Exam  Nursing note and vitals reviewed. Constitutional: She is oriented to person, place, and time. She appears well-developed and well-nourished. No distress.  Cardiovascular: Normal rate.   Respiratory: Effort normal.  GI: Soft. She exhibits no distension. There is no tenderness. There is no rebound and no guarding.  Genitourinary:        External: normal Vagina: pink, smooth, small amount of white discharge Cervix: pink, smooth, closed, no CMT Uterus: about 8-9 weeks size, non-tender Adnexa: non-tender  Neurological: She is alert and oriented to person, place, and time.  Skin: Skin is warm and dry.  Psychiatric: She has a normal mood and affect.    MAU Course  Procedures  Results for orders placed during the hospital encounter of 10/23/12 (from the past 24 hour(s))  URINALYSIS, ROUTINE W REFLEX MICROSCOPIC     Status: Abnormal   Collection Time   10/23/12  2:15 AM      Component Value Range   Color, Urine YELLOW  YELLOW   APPearance CLEAR  CLEAR   Specific Gravity, Urine >  1.030 (*) 1.005 - 1.030   pH 6.0  5.0 - 8.0   Glucose, UA NEGATIVE  NEGATIVE mg/dL   Hgb urine dipstick NEGATIVE  NEGATIVE   Bilirubin Urine NEGATIVE  NEGATIVE   Ketones, ur 15 (*) NEGATIVE mg/dL   Protein, ur NEGATIVE  NEGATIVE mg/dL   Urobilinogen, UA 0.2  0.0 - 1.0 mg/dL   Nitrite NEGATIVE  NEGATIVE   Leukocytes, UA NEGATIVE  NEGATIVE  WET PREP, GENITAL     Status: Abnormal   Collection Time   10/23/12  2:45 AM      Component Value Range   Yeast Wet Prep HPF POC NONE SEEN  NONE SEEN   Trich, Wet Prep FEW (*) NONE SEEN   Clue Cells Wet Prep HPF POC MODERATE (*) NONE SEEN   WBC, Wet Prep HPF POC MODERATE (*) NONE SEEN    0345: Pt has been able to tolerate cracker and ginger ale. Will attempt to give her the Flagyl. If she can keep the meds down she will be ok for dc home.  Assessment and Plan   1. Nausea and vomiting in pregnancy   2. Trichomonas vaginalis (TV) infection    Treated here in MAU RX for phenergan Start prenatal care as soon as possible  Tawnya Crook 10/23/2012, 2:46 AM

## 2012-10-23 NOTE — MAU Note (Signed)
Pt here for n/v/d. States hasn't been able to keep anything down for 8 weeks. Diarrhea x 2 days. Denies vaginal bleeding or discharge. Lower abdominal cramping x 1 week. Fell up the stairs yesterday evening and landed on abdomen.

## 2012-10-24 LAB — GC/CHLAMYDIA PROBE AMP: GC Probe RNA: NEGATIVE

## 2012-11-16 ENCOUNTER — Other Ambulatory Visit (HOSPITAL_COMMUNITY)
Admission: RE | Admit: 2012-11-16 | Discharge: 2012-11-16 | Disposition: A | Discharge status: Home / Self Care | Payer: Medicaid Other | Source: Ambulatory Visit | Attending: Obstetrics and Gynecology | Admitting: Obstetrics and Gynecology

## 2012-11-16 ENCOUNTER — Other Ambulatory Visit: Payer: Self-pay | Admitting: Adult Health

## 2012-11-16 DIAGNOSIS — Z01419 Encounter for gynecological examination (general) (routine) without abnormal findings: Secondary | ICD-10-CM | POA: Insufficient documentation

## 2012-11-16 DIAGNOSIS — N760 Acute vaginitis: Secondary | ICD-10-CM | POA: Insufficient documentation

## 2012-11-16 DIAGNOSIS — Z113 Encounter for screening for infections with a predominantly sexual mode of transmission: Secondary | ICD-10-CM | POA: Insufficient documentation

## 2012-11-16 LAB — OB RESULTS CONSOLE RUBELLA ANTIBODY, IGM: Rubella: IMMUNE

## 2012-11-16 LAB — OB RESULTS CONSOLE VARICELLA ZOSTER ANTIBODY, IGG: Varicella: IMMUNE

## 2012-11-16 LAB — OB RESULTS CONSOLE RPR: RPR: NONREACTIVE

## 2012-11-16 LAB — SICKLE CELL SCREEN: Sickle Cell Screen: NEGATIVE

## 2012-11-16 LAB — CYSTIC FIBROSIS DIAGNOSTIC STUDY: Interpretation-CFDNA:: NEGATIVE

## 2012-11-16 LAB — OB RESULTS CONSOLE PLATELET COUNT: Platelets: 309 10*3/uL

## 2012-11-23 ENCOUNTER — Encounter: Payer: Self-pay | Admitting: Adult Health

## 2012-11-23 DIAGNOSIS — J45909 Unspecified asthma, uncomplicated: Secondary | ICD-10-CM

## 2012-11-23 DIAGNOSIS — E669 Obesity, unspecified: Secondary | ICD-10-CM

## 2012-11-23 DIAGNOSIS — O219 Vomiting of pregnancy, unspecified: Secondary | ICD-10-CM

## 2012-11-23 HISTORY — DX: Obesity, unspecified: E66.9

## 2012-12-02 ENCOUNTER — Inpatient Hospital Stay (HOSPITAL_COMMUNITY)
Admission: AD | Admit: 2012-12-02 | Discharge: 2012-12-02 | Disposition: A | Discharge status: Home / Self Care | Payer: Medicaid Other | Source: Ambulatory Visit | Attending: Obstetrics & Gynecology | Admitting: Obstetrics & Gynecology

## 2012-12-02 ENCOUNTER — Encounter (HOSPITAL_COMMUNITY): Payer: Self-pay | Admitting: *Deleted

## 2012-12-02 ENCOUNTER — Telehealth: Payer: Self-pay | Admitting: Advanced Practice Midwife

## 2012-12-02 ENCOUNTER — Other Ambulatory Visit: Payer: Self-pay | Admitting: Obstetrics & Gynecology

## 2012-12-02 DIAGNOSIS — J029 Acute pharyngitis, unspecified: Secondary | ICD-10-CM

## 2012-12-02 DIAGNOSIS — Z1389 Encounter for screening for other disorder: Secondary | ICD-10-CM

## 2012-12-02 DIAGNOSIS — N760 Acute vaginitis: Secondary | ICD-10-CM | POA: Insufficient documentation

## 2012-12-02 DIAGNOSIS — A499 Bacterial infection, unspecified: Secondary | ICD-10-CM | POA: Insufficient documentation

## 2012-12-02 DIAGNOSIS — B9689 Other specified bacterial agents as the cause of diseases classified elsewhere: Secondary | ICD-10-CM | POA: Insufficient documentation

## 2012-12-02 DIAGNOSIS — O99891 Other specified diseases and conditions complicating pregnancy: Secondary | ICD-10-CM | POA: Insufficient documentation

## 2012-12-02 DIAGNOSIS — O2390 Unspecified genitourinary tract infection in pregnancy, unspecified trimester: Secondary | ICD-10-CM | POA: Insufficient documentation

## 2012-12-02 LAB — URINALYSIS, ROUTINE W REFLEX MICROSCOPIC
Bilirubin Urine: NEGATIVE
Ketones, ur: 15 mg/dL — AB
Nitrite: NEGATIVE
Protein, ur: NEGATIVE mg/dL
Urobilinogen, UA: 0.2 mg/dL (ref 0.0–1.0)

## 2012-12-02 MED ORDER — METRONIDAZOLE 500 MG PO TABS: 500.0000 mg | ORAL_TABLET | Freq: Two times a day (BID) | ORAL | Status: DC

## 2012-12-02 NOTE — MAU Provider Note (Signed)
History     CSN: [id number]  Arrival date and time: 12/02/12 1652   First Provider Initiated Contact with Patient 12/02/12 1741      Chief Complaint  Patient presents with  . Sore Throat  . Vaginal Bleeding   HPI Leslie Mayer is 22 y.o. G1P0000 [redacted]w[redacted]d weeks presenting with a sore throat that began today when she woke up. States it hurts to swallow.  Denies fever.   She was seen at [facility] 2 days ago for gestational diabetes screening.  She thinks the "orange" drink may have caused it.  She also had vaginal bleeding last night before intercourse.  Described as heavy but lighter now.  A little cramping but she says she always cramp.    Past Medical History  Diagnosis Date  . Asthma   . Recurrent sinus infections   . Heart murmur   . Obesity 11/23/2012  . Nausea and vomiting in pregnancy 11/23/2012    Past Surgical History  Procedure Laterality Date  . No past surgeries      Family History  Problem Relation Age of Onset  . Hypertension Other   . Cancer Other     mat great gma throat cancer  . Heart disease Paternal Grandmother   . Hypertension Paternal Grandmother     History  Substance Use Topics  . Smoking status: Former Smoker -- 1.00 packs/day    Types: Cigarettes  . Smokeless tobacco: Never Used  . Alcohol Use: No    Allergies: No Known Allergies  Prescriptions prior to admission  Medication Sig Dispense Refill  . acetaminophen (TYLENOL) 500 MG tablet Take 1,000 mg by mouth every 6 (six) hours as needed for pain.      Marland Kitchen albuterol (PROVENTIL HFA;VENTOLIN HFA) 108 (90 BASE) MCG/ACT inhaler Inhale 2 puffs into the lungs every 6 (six) hours as needed.      Marland Kitchen gentamicin (GARAMYCIN) 0.3 % ophthalmic solution Place 1 drop into both eyes 2 (two) times daily.      . prenatal vitamin w/FE, FA (PRENATAL 1 + 1) 27-1 MG TABS Take 1 tablet by mouth daily at 12 noon.        Review of Systems  Constitutional: Negative for fever and chills.  HENT: Positive for sore  throat.   Gastrointestinal: Positive for abdominal pain (mild cramping). Negative for nausea and vomiting.  Genitourinary:       + for vaginal spotting.   Physical Exam   Blood pressure 138/79, pulse 120, temperature 98.3 F (36.8 C), temperature source Axillary, resp. rate 18, height 5\' 5"  (1.651 m), weight 292 lb 9.6 oz (132.722 kg), last menstrual period 08/16/2012.  Physical Exam  Constitutional: She is oriented to person, place, and time. She appears well-developed and well-nourished. No distress.  HENT:  Head: Normocephalic.  Mouth/Throat: Posterior oropharyngeal erythema present. No oropharyngeal exudate, posterior oropharyngeal edema or tonsillar abscesses.  Neck: Normal range of motion.  Cardiovascular: Normal rate.   Respiratory: Effort normal.  GI: Soft. She exhibits no distension and no mass. There is no tenderness. There is no rebound and no guarding.  Genitourinary: There is no rash, tenderness or lesion on the right labia. There is no rash, tenderness or lesion on the left labia. No tenderness or bleeding around the vagina. Vaginal discharge (white frothy discharge without any signs of bleeding) found.  Negative for adenopathy  Neurological: She is alert and oriented to person, place, and time.  Skin: Skin is warm and dry.  Psychiatric: She has a  normal mood and affect. Her behavior is normal.   FETAL HEART RATE BY DOPPLER 148 MAU Course  Procedures  MDM 19:00 called lab--2 more hours on throat culture.  Will let the patient go home and call her with results. Assessment and Plan  A:  Sore throat      Bacterial vaginosis      P:  Rx for Flagyl 500mg  bid X 1 week    Will call antibiotic to pharmacy if culture positive     Keep scheduled appointment  Jermiah Soderman,EVE M 12/02/2012, 6:44 PM

## 2012-12-02 NOTE — MAU Provider Note (Signed)
Attestation of Attending Supervision of Advanced Practitioner (CNM/NP): Evaluation and management procedures were performed by the Advanced Practitioner under my supervision and collaboration.  I have reviewed the Advanced Practitioner's note and chart, and I agree with the management and plan.  HARRAWAY-SMITH, Sinjin Amero 9:07 PM     

## 2012-12-02 NOTE — MAU Note (Signed)
Pt presents with complaint of sore throat since this am, says that it is difficult to swallow. Pt also presents with complaints of bright red vaginal bleeding since last pm

## 2012-12-02 NOTE — Telephone Encounter (Signed)
Rapid Strep: negative

## 2012-12-07 ENCOUNTER — Encounter: Payer: Self-pay | Admitting: Obstetrics & Gynecology

## 2012-12-28 ENCOUNTER — Ambulatory Visit (INDEPENDENT_AMBULATORY_CARE_PROVIDER_SITE_OTHER): Payer: Medicaid Other | Admitting: Obstetrics and Gynecology

## 2012-12-28 ENCOUNTER — Ambulatory Visit (INDEPENDENT_AMBULATORY_CARE_PROVIDER_SITE_OTHER): Payer: Medicaid Other

## 2012-12-28 ENCOUNTER — Encounter: Payer: Self-pay | Admitting: Obstetrics and Gynecology

## 2012-12-28 ENCOUNTER — Encounter: Payer: Self-pay | Admitting: Advanced Practice Midwife

## 2012-12-28 VITALS — BP 120/82 | Wt 297.4 lb

## 2012-12-28 DIAGNOSIS — F1920 Other psychoactive substance dependence, uncomplicated: Secondary | ICD-10-CM

## 2012-12-28 DIAGNOSIS — Z3402 Encounter for supervision of normal first pregnancy, second trimester: Secondary | ICD-10-CM

## 2012-12-28 DIAGNOSIS — IMO0002 Reserved for concepts with insufficient information to code with codable children: Secondary | ICD-10-CM

## 2012-12-28 DIAGNOSIS — O26899 Other specified pregnancy related conditions, unspecified trimester: Secondary | ICD-10-CM

## 2012-12-28 DIAGNOSIS — O99322 Drug use complicating pregnancy, second trimester: Secondary | ICD-10-CM

## 2012-12-28 DIAGNOSIS — O99320 Drug use complicating pregnancy, unspecified trimester: Secondary | ICD-10-CM

## 2012-12-28 DIAGNOSIS — O9989 Other specified diseases and conditions complicating pregnancy, childbirth and the puerperium: Secondary | ICD-10-CM

## 2012-12-28 DIAGNOSIS — Z331 Pregnant state, incidental: Secondary | ICD-10-CM

## 2012-12-28 DIAGNOSIS — Z1389 Encounter for screening for other disorder: Secondary | ICD-10-CM

## 2012-12-28 DIAGNOSIS — O2602 Excessive weight gain in pregnancy, second trimester: Secondary | ICD-10-CM

## 2012-12-28 LAB — US OB DETAIL + 14 WK

## 2012-12-28 LAB — POCT URINALYSIS DIPSTICK
Nitrite, UA: NEGATIVE
Protein, UA: NEGATIVE

## 2012-12-28 NOTE — Progress Notes (Signed)
Multiple c/o : Pt anxious re round lig pains, but willing to accept dx.  Advise abd binder or cloth belt support                    Excess wt gain : will follow, diet discussed

## 2012-12-28 NOTE — Progress Notes (Deleted)
U/S(19+6wks)-twin IUP with + FCA noted x2, meas c/w dates x 2  (although FL and HL meas c/w 18 wks) cx long and closed, bilateral adnexa wnl, membrane noted, all anterior gr 0 plac, BABY A-active fetus, breech, no major abnl noted, female fetus, BABY B- active breech fetus, no obvious abnl noted although sub-optimal views of anatomy due to fetal position, female fetus.  Would like to reck anat at 26 wks

## 2012-12-28 NOTE — Patient Instructions (Addendum)
Please review round ligament pain comfort measures

## 2012-12-28 NOTE — Progress Notes (Signed)
U/S(20+2wks)-single active vtx fetus, approp growth meas c/w dates, fluid wnl, post gr 0 plac, cx appears long and closed, bilateral adnexa wnl, LT VENT EICF noted, no other aobvious abnl noted although sub-optimal anatomy screen secondary to maternal body habitus (BMI=48), female fetus

## 2012-12-28 NOTE — Progress Notes (Signed)
Not taking any prenatal vitamins. Swelling and pain in feet. Small amount of spotting after intercourse.

## 2013-01-23 ENCOUNTER — Encounter (HOSPITAL_COMMUNITY): Payer: Self-pay

## 2013-01-23 ENCOUNTER — Inpatient Hospital Stay (HOSPITAL_COMMUNITY)
Admission: AD | Admit: 2013-01-23 | Discharge: 2013-01-23 | Disposition: A | Discharge status: Home / Self Care | Payer: Medicaid Other | Source: Ambulatory Visit | Attending: Family Medicine | Admitting: Family Medicine

## 2013-01-23 ENCOUNTER — Encounter: Payer: Self-pay | Admitting: Obstetrics & Gynecology

## 2013-01-23 DIAGNOSIS — B3731 Acute candidiasis of vulva and vagina: Secondary | ICD-10-CM | POA: Insufficient documentation

## 2013-01-23 DIAGNOSIS — O2390 Unspecified genitourinary tract infection in pregnancy, unspecified trimester: Secondary | ICD-10-CM | POA: Insufficient documentation

## 2013-01-23 DIAGNOSIS — B373 Candidiasis of vulva and vagina: Secondary | ICD-10-CM

## 2013-01-23 DIAGNOSIS — N949 Unspecified condition associated with female genital organs and menstrual cycle: Secondary | ICD-10-CM | POA: Insufficient documentation

## 2013-01-23 LAB — URINALYSIS, ROUTINE W REFLEX MICROSCOPIC
Bilirubin Urine: NEGATIVE
Glucose, UA: NEGATIVE mg/dL
Ketones, ur: NEGATIVE mg/dL
Nitrite: NEGATIVE
Protein, ur: NEGATIVE mg/dL
Specific Gravity, Urine: >1.03 — ABNORMAL HIGH (ref 1.005–1.030)
Urobilinogen, UA: 0.2 mg/dL (ref 0.0–1.0)
pH: 6 (ref 5.0–8.0)

## 2013-01-23 LAB — WET PREP, GENITAL: Trich, Wet Prep: NONE SEEN

## 2013-01-23 LAB — URINE MICROSCOPIC-ADD ON

## 2013-01-23 LAB — GLUCOSE, CAPILLARY: Glucose-Capillary: 79 mg/dL (ref 70–99)

## 2013-01-23 MED ORDER — ZINC OXIDE 20 % EX OINT
TOPICAL_OINTMENT | Freq: Once | CUTANEOUS | Status: AC
  Administered 2013-01-23: 1 via TOPICAL
  Filled 2013-01-23: qty 56.7

## 2013-01-23 MED ORDER — FLUCONAZOLE 150 MG PO TABS: 150.0000 mg | ORAL_TABLET | Freq: Once | ORAL | Status: DC

## 2013-01-23 MED ORDER — NYSTATIN 100000 UNIT/GM EX POWD: Freq: Three times a day (TID) | CUTANEOUS | Status: DC | PRN

## 2013-01-23 MED ORDER — HYDROCODONE-ACETAMINOPHEN 5-325 MG PO TABS: 2.0000 | ORAL_TABLET | Freq: Every evening | ORAL | Status: DC | PRN

## 2013-01-23 MED ORDER — FLUCONAZOLE 150 MG PO TABS
150.0000 mg | ORAL_TABLET | Freq: Once | ORAL | Status: AC
  Administered 2013-01-23: 150 mg via ORAL
  Filled 2013-01-23: qty 1

## 2013-01-23 MED ORDER — HYDROCODONE-ACETAMINOPHEN 5-325 MG PO TABS
2.0000 | ORAL_TABLET | Freq: Once | ORAL | Status: AC
  Administered 2013-01-23: 2 via ORAL
  Filled 2013-01-23: qty 2

## 2013-01-23 MED ORDER — CLOTRIMAZOLE-BETAMETHASONE 1-0.05 % EX CREA: TOPICAL_CREAM | Freq: Two times a day (BID) | CUTANEOUS | Status: DC

## 2013-01-23 NOTE — MAU Note (Signed)
Pt G1 at 24wks having vaginal irritation x 2 wks.  Spotting since last night.  Cramping x 2-3 days.

## 2013-01-23 NOTE — MAU Provider Note (Signed)
History     CSN: [id number]  Arrival date and time: 01/23/13 1850   None     Chief Complaint  Patient presents with  . Vaginal Pain   HPI 22 y.o. G1P0000 at [redacted]w[redacted]d with rash in vaginal/groin area for about a week. Itchy, burning, hurting. Has tried hydrocortisone, vaseline and cornstarch with no improvement. Very painful. Some spotting on TP over last few days. No cramping/contractions. No loss of fluid. Baby moving normally.   Prenatal care at [facility] No complications of this pregnancy so far.   OB History   Grav Para Term Preterm Abortions TAB SAB Ect Mult Living   1 0 0 0 0 0 0 0 0 0       Past Medical History  Diagnosis Date  . Asthma   . Recurrent sinus infections   . Heart murmur   . Obesity 11/23/2012  . Nausea and vomiting in pregnancy 11/23/2012    Past Surgical History  Procedure Laterality Date  . No past surgeries      Family History  Problem Relation Age of Onset  . Hypertension Other   . Cancer Other     mat great gma throat cancer  . Heart disease Paternal Grandmother   . Hypertension Paternal Grandmother     History  Substance Use Topics  . Smoking status: Former Smoker -- 1.00 packs/day    Types: Cigarettes  . Smokeless tobacco: Never Used  . Alcohol Use: No    Allergies: No Known Allergies  Prescriptions prior to admission  Medication Sig Dispense Refill  . acetaminophen (TYLENOL) 500 MG tablet Take 1,000 mg by mouth every 6 (six) hours as needed for pain.      Marland Kitchen loratadine (CLARITIN) 10 MG tablet Take 10 mg by mouth daily.      Marland Kitchen albuterol (PROVENTIL HFA;VENTOLIN HFA) 108 (90 BASE) MCG/ACT inhaler Inhale 2 puffs into the lungs every 6 (six) hours as needed.      Marland Kitchen gentamicin (GARAMYCIN) 0.3 % ophthalmic solution Place 1 drop into both eyes 2 (two) times daily.      . metroNIDAZOLE (FLAGYL) 500 MG tablet Take 1 tablet (500 mg total) by mouth 2 (two) times daily.  14 tablet  0  . prenatal vitamin w/FE, FA (PRENATAL 1 + 1) 27-1 MG TABS  Take 1 tablet by mouth daily at 12 noon.        Review of Systems  Constitutional: Negative for fever and chills.  Eyes: Negative for blurred vision and double vision.  Gastrointestinal: Negative for nausea, vomiting and abdominal pain.  Genitourinary: Negative for dysuria.  Skin: Positive for itching and rash.  Neurological: Negative for headaches.   Physical Exam   Blood pressure 131/71, pulse 110, temperature 98.2 F (36.8 C), temperature source Oral, resp. rate 20, height 5\' 4"  (1.626 m), weight 137.984 kg (304 lb 3.2 oz), last menstrual period 08/16/2012.  Physical Exam  Constitutional: She is oriented to person, place, and time. She appears well-developed. No distress.  Morbidly obese  HENT:  Head: Normocephalic and atraumatic.  Eyes: Conjunctivae and EOM are normal.  Neck: Normal range of motion. Neck supple.  Cardiovascular: Normal rate, regular rhythm and normal heart sounds.   Respiratory: Effort normal and breath sounds normal. No respiratory distress.  GI: Soft. There is no tenderness. There is no rebound and no guarding.  Genitourinary:  Groin area, mons, labia with erythema, edema, maculopapular rash. White discharge.  Musculoskeletal: She exhibits no tenderness.  Neurological: She is alert and  oriented to person, place, and time.  Skin: Skin is warm and dry.  Psychiatric: She has a normal mood and affect.   Results for orders placed during the hospital encounter of 01/23/13 (from the past 24 hour(s))  URINALYSIS, ROUTINE W REFLEX MICROSCOPIC     Status: Abnormal   Collection Time    01/23/13  7:21 PM      Result Value Range   Color, Urine YELLOW  YELLOW   APPearance CLOUDY (*) CLEAR   Specific Gravity, Urine >1.030 (*) 1.005 - 1.030   pH 6.0  5.0 - 8.0   Glucose, UA NEGATIVE  NEGATIVE mg/dL   Hgb urine dipstick TRACE (*) NEGATIVE   Bilirubin Urine NEGATIVE  NEGATIVE   Ketones, ur NEGATIVE  NEGATIVE mg/dL   Protein, ur NEGATIVE  NEGATIVE mg/dL    Urobilinogen, UA 0.2  0.0 - 1.0 mg/dL   Nitrite NEGATIVE  NEGATIVE   Leukocytes, UA LARGE (*) NEGATIVE  URINE MICROSCOPIC-ADD ON     Status: Abnormal   Collection Time    01/23/13  7:21 PM      Result Value Range   Squamous Epithelial / LPF FEW (*) RARE   WBC, UA 21-50  <3 WBC/hpf   RBC / HPF 21-50  <3 RBC/hpf   Bacteria, UA MANY (*) RARE   Urine-Other MUCOUS PRESENT    WET PREP, GENITAL     Status: Abnormal   Collection Time    01/23/13  9:41 PM      Result Value Range   Yeast Wet Prep HPF POC NONE SEEN  NONE SEEN   Trich, Wet Prep NONE SEEN  NONE SEEN   Clue Cells Wet Prep HPF POC NONE SEEN  NONE SEEN   WBC, Wet Prep HPF POC MODERATE (*) NONE SEEN  GLUCOSE, CAPILLARY     Status: None   Collection Time    01/23/13 10:31 PM      Result Value Range   Glucose-Capillary 79  70 - 99 mg/dL   FHTs:  086, good variability, 10x10 accels present, no decels, discontinuous tracing. Appropriate for gestational age. TOCO:  irritability  MAU Course  Procedures  Assessment and Plan  22 y.o. G1P0000 at [redacted]w[redacted]d with  - Candidal rash in perineal/vulvar area - very irritated Diflucan, nystatin, lortisone Follow up with OB at [facility] on 5/7 as scheduled  Napoleon Form 01/23/2013, 9:30 PM

## 2013-01-24 LAB — GC/CHLAMYDIA PROBE AMP
CT Probe RNA: NEGATIVE
GC Probe RNA: NEGATIVE

## 2013-01-24 LAB — HERPES SIMPLEX VIRUS(HSV) DNA BY PCR: HSV 2 DNA: NOT DETECTED

## 2013-01-24 NOTE — MAU Provider Note (Signed)
Chart reviewed and agree with management and plan.  

## 2013-01-25 ENCOUNTER — Encounter: Payer: Self-pay | Admitting: Advanced Practice Midwife

## 2013-01-27 ENCOUNTER — Encounter: Payer: Self-pay | Admitting: Family Medicine

## 2013-01-27 ENCOUNTER — Other Ambulatory Visit: Payer: Self-pay | Admitting: Family Medicine

## 2013-01-27 DIAGNOSIS — B951 Streptococcus, group B, as the cause of diseases classified elsewhere: Secondary | ICD-10-CM

## 2013-01-27 LAB — URINE CULTURE: Colony Count: >=100000

## 2013-01-27 MED ORDER — AMOXICILLIN 500 MG PO CAPS: 500.0000 mg | ORAL_CAPSULE | Freq: Three times a day (TID) | ORAL | Status: DC

## 2013-01-29 DIAGNOSIS — O9989 Other specified diseases and conditions complicating pregnancy, childbirth and the puerperium: Secondary | ICD-10-CM | POA: Insufficient documentation

## 2013-01-29 DIAGNOSIS — Y939 Activity, unspecified: Secondary | ICD-10-CM | POA: Insufficient documentation

## 2013-01-29 DIAGNOSIS — Z79899 Other long term (current) drug therapy: Secondary | ICD-10-CM | POA: Insufficient documentation

## 2013-01-29 DIAGNOSIS — S90569A Insect bite (nonvenomous), unspecified ankle, initial encounter: Secondary | ICD-10-CM | POA: Insufficient documentation

## 2013-01-29 DIAGNOSIS — J45909 Unspecified asthma, uncomplicated: Secondary | ICD-10-CM | POA: Insufficient documentation

## 2013-01-29 DIAGNOSIS — L02419 Cutaneous abscess of limb, unspecified: Secondary | ICD-10-CM | POA: Insufficient documentation

## 2013-01-29 DIAGNOSIS — E669 Obesity, unspecified: Secondary | ICD-10-CM | POA: Insufficient documentation

## 2013-01-29 DIAGNOSIS — W57XXXA Bitten or stung by nonvenomous insect and other nonvenomous arthropods, initial encounter: Secondary | ICD-10-CM | POA: Insufficient documentation

## 2013-01-29 DIAGNOSIS — H5789 Other specified disorders of eye and adnexa: Secondary | ICD-10-CM | POA: Insufficient documentation

## 2013-01-29 DIAGNOSIS — R011 Cardiac murmur, unspecified: Secondary | ICD-10-CM | POA: Insufficient documentation

## 2013-01-29 DIAGNOSIS — Z87891 Personal history of nicotine dependence: Secondary | ICD-10-CM | POA: Insufficient documentation

## 2013-01-29 DIAGNOSIS — Y929 Unspecified place or not applicable: Secondary | ICD-10-CM | POA: Insufficient documentation

## 2013-01-29 NOTE — ED Notes (Signed)
Patient states was bitten by something on inside of right thigh.  Right thigh reddened and warm to touch.

## 2013-01-30 ENCOUNTER — Ambulatory Visit (INDEPENDENT_AMBULATORY_CARE_PROVIDER_SITE_OTHER): Payer: Medicaid Other | Admitting: Obstetrics & Gynecology

## 2013-01-30 ENCOUNTER — Encounter: Payer: Self-pay | Admitting: Obstetrics & Gynecology

## 2013-01-30 ENCOUNTER — Emergency Department (HOSPITAL_COMMUNITY)
Admission: EM | Admit: 2013-01-30 | Discharge: 2013-01-30 | Disposition: A | Discharge status: Home / Self Care | Payer: Medicaid Other | Attending: Emergency Medicine | Admitting: Emergency Medicine

## 2013-01-30 VITALS — BP 128/60 | Wt 301.0 lb

## 2013-01-30 DIAGNOSIS — Z331 Pregnant state, incidental: Secondary | ICD-10-CM

## 2013-01-30 DIAGNOSIS — L02419 Cutaneous abscess of limb, unspecified: Secondary | ICD-10-CM

## 2013-01-30 DIAGNOSIS — Z1389 Encounter for screening for other disorder: Secondary | ICD-10-CM

## 2013-01-30 DIAGNOSIS — F1920 Other psychoactive substance dependence, uncomplicated: Secondary | ICD-10-CM

## 2013-01-30 DIAGNOSIS — IMO0002 Reserved for concepts with insufficient information to code with codable children: Secondary | ICD-10-CM

## 2013-01-30 DIAGNOSIS — Z3490 Encounter for supervision of normal pregnancy, unspecified, unspecified trimester: Secondary | ICD-10-CM

## 2013-01-30 LAB — POCT URINALYSIS DIPSTICK
Blood, UA: NEGATIVE
Glucose, UA: NEGATIVE
Ketones, UA: NEGATIVE
Nitrite, UA: NEGATIVE

## 2013-01-30 MED ORDER — HYDROCODONE-ACETAMINOPHEN 5-325 MG PO TABS: 1.0000 | ORAL_TABLET | Freq: Four times a day (QID) | ORAL | Status: DC | PRN

## 2013-01-30 MED ORDER — HYDROCODONE-ACETAMINOPHEN 5-325 MG PO TABS
1.0000 | ORAL_TABLET | Freq: Once | ORAL | Status: AC
  Administered 2013-01-30: 1 via ORAL
  Filled 2013-01-30: qty 1

## 2013-01-30 MED ORDER — CEPHALEXIN 500 MG PO CAPS
500.0000 mg | ORAL_CAPSULE | Freq: Once | ORAL | Status: AC
Start: 2013-01-30 — End: 2013-01-30
  Administered 2013-01-30: 500 mg via ORAL
  Filled 2013-01-30: qty 1

## 2013-01-30 MED ORDER — CEPHALEXIN 500 MG PO CAPS: 500.0000 mg | ORAL_CAPSULE | Freq: Four times a day (QID) | ORAL | Status: DC

## 2013-01-30 NOTE — ED Provider Notes (Signed)
History     CSN: [id number]  Arrival date & time 01/29/13  2334   First MD Initiated Contact with Patient 01/30/13 0104      Chief Complaint  Patient presents with  . Insect Bite    (Consider location/radiation/quality/duration/timing/severity/associated sxs/prior treatment) The history is provided by the patient.   22 year old female followed by [facility] patient is 6 months pregnant. Due date is 05/15/2013. Patient has a tender red area on the back part of her right thigh that she thinks is due to an insect bite however not specifically aware of an insect bit her. She's had trouble with boils in that area in the past. Tenderness is a 10 out of 10. No fevers no nausea no vomiting no systemic symptoms. No vaginal bleeding no vaginal discharge no pelvic pain.  Past Medical History  Diagnosis Date  . Asthma   . Recurrent sinus infections   . Heart murmur   . Obesity 11/23/2012  . Nausea and vomiting in pregnancy 11/23/2012    Past Surgical History  Procedure Laterality Date  . No past surgeries      Family History  Problem Relation Age of Onset  . Hypertension Other   . Cancer Other     mat great gma throat cancer  . Heart disease Paternal Grandmother   . Hypertension Paternal Grandmother     History  Substance Use Topics  . Smoking status: Former Smoker -- 1.00 packs/day    Types: Cigarettes  . Smokeless tobacco: Never Used  . Alcohol Use: No    OB History   Grav Para Term Preterm Abortions TAB SAB Ect Mult Living   1 0 0 0 0 0 0 0 0 0       Review of Systems  Constitutional: Negative for fever.  HENT: Negative for congestion.   Eyes: Positive for redness.  Respiratory: Negative for shortness of breath.   Cardiovascular: Negative for chest pain.  Gastrointestinal: Negative for abdominal pain.  Genitourinary: Negative for dysuria, vaginal bleeding and vaginal discharge.  Musculoskeletal: Negative for back pain.  Skin: Negative for rash.   Neurological: Negative for headaches.  Psychiatric/Behavioral: Negative for confusion.    Allergies  Review of patient's allergies indicates no known allergies.  Home Medications   Current Outpatient Rx  Name  Route  Sig  Dispense  Refill  . acetaminophen (TYLENOL) 500 MG tablet   Oral   Take 1,000 mg by mouth every 6 (six) hours as needed for pain.         Marland Kitchen albuterol (PROVENTIL HFA;VENTOLIN HFA) 108 (90 BASE) MCG/ACT inhaler   Inhalation   Inhale 2 puffs into the lungs every 6 (six) hours as needed.         Marland Kitchen amoxicillin (AMOXIL) 500 MG capsule   Oral   Take 1 capsule (500 mg total) by mouth 3 (three) times daily.   21 capsule   2   . cephALEXin (KEFLEX) 500 MG capsule   Oral   Take 1 capsule (500 mg total) by mouth 4 (four) times daily.   28 capsule   0   . clotrimazole-betamethasone (LOTRISONE) cream   Topical   Apply topically 2 (two) times daily.   45 g   1   . fluconazole (DIFLUCAN) 150 MG tablet   Oral   Take 1 tablet (150 mg total) by mouth once. Take one tablet by mouth in 3 days (01/26/13)   1 tablet   0   . gentamicin (GARAMYCIN) 0.3 %  ophthalmic solution   Both Eyes   Place 1 drop into both eyes 2 (two) times daily.         Marland Kitchen HYDROcodone-acetaminophen (NORCO/VICODIN) 5-325 MG per tablet   Oral   Take 2 tablets by mouth at bedtime as needed for pain.   5 tablet   0   . HYDROcodone-acetaminophen (NORCO/VICODIN) 5-325 MG per tablet   Oral   Take 1-2 tablets by mouth every 6 (six) hours as needed for pain.   10 tablet   0   . loratadine (CLARITIN) 10 MG tablet   Oral   Take 10 mg by mouth daily.         . metroNIDAZOLE (FLAGYL) 500 MG tablet   Oral   Take 1 tablet (500 mg total) by mouth 2 (two) times daily.   14 tablet   0   . nystatin (MYCOSTATIN) powder   Topical   Apply topically 3 (three) times daily as needed.   60 g   0   . prenatal vitamin w/FE, FA (PRENATAL 1 + 1) 27-1 MG TABS   Oral   Take 1 tablet by mouth daily  at 12 noon.           BP 128/51  Pulse 91  Temp(Src) 97.7 F (36.5 C) (Oral)  Resp 20  Ht 5\' 5"  (1.651 m)  Wt 302 lb (136.986 kg)  BMI 50.26 kg/m2  SpO2 100%  LMP 08/16/2012  Physical Exam  Nursing note and vitals reviewed. Constitutional: She is oriented to person, place, and time. She appears well-developed and well-nourished. No distress.  HENT:  Head: Normocephalic and atraumatic.  Eyes: Conjunctivae and EOM are normal. Pupils are equal, round, and reactive to light.  Neck: Normal range of motion.  Cardiovascular: Normal rate, regular rhythm and normal heart sounds.   No murmur heard. Pulmonary/Chest: Effort normal and breath sounds normal. No respiratory distress.  Abdominal: Soft. Bowel sounds are normal. There is no tenderness.  Gravid with uterus above the umbilicus. Nontender.  Musculoskeletal: Normal range of motion. She exhibits tenderness.  Normal except for the posterior aspect of the right thigh with a area of 7 cm of erythema area of 2 cm of induration with a pustular punctate lesion in the center measuring about 2 mm. No distinct fluctuance.  Neurological: She is alert and oriented to person, place, and time. No cranial nerve deficit. She exhibits normal muscle tone. Coordination normal.  Skin: Skin is warm. No rash noted. There is erythema.    ED Course  Procedures (including critical care time)  Labs Reviewed - No data to display No results found.   1. Cellulitis and abscess of leg   2. Pregnancy       MDM  Patient is 6 months pregnant. Followed by [facility]. Developing a boil with surrounding cellulitis on the posterior aspect of her right thigh. Patient's had trouble with boils in that area before. Patient initially thought maybe she was bitten by something. No distinct abscessed I&D at this point. Patient treated with Keflex in the emergency department and hydrocodone.        Shelda Jakes, MD 01/30/13 [phone]

## 2013-01-30 NOTE — Progress Notes (Signed)
WENT TO ER  Yesterday, having some redness in right thigh.

## 2013-01-30 NOTE — Progress Notes (Signed)
BP weight and urine results all reviewed and noted. Patient reports good fetal movement, denies any bleeding and no rupture of membranes symptoms or regular contractions. Patient is without complaints. All questions were answered.  

## 2013-01-30 NOTE — ED Notes (Signed)
Pt presents with suspected insect bite to the right inner theigh, moderate swelling/cellulitis noted around the site, small white raised bump noted in the center. Pt complains of burning pain to the area, no other complaints.

## 2013-02-08 ENCOUNTER — Emergency Department (HOSPITAL_COMMUNITY)
Admission: EM | Admit: 2013-02-08 | Discharge: 2013-02-08 | Disposition: A | Discharge status: Home / Self Care | Payer: Medicaid Other | Attending: Emergency Medicine | Admitting: Emergency Medicine

## 2013-02-08 ENCOUNTER — Encounter (HOSPITAL_COMMUNITY): Payer: Self-pay | Admitting: Emergency Medicine

## 2013-02-08 DIAGNOSIS — R011 Cardiac murmur, unspecified: Secondary | ICD-10-CM | POA: Insufficient documentation

## 2013-02-08 DIAGNOSIS — T50905A Adverse effect of unspecified drugs, medicaments and biological substances, initial encounter: Secondary | ICD-10-CM

## 2013-02-08 DIAGNOSIS — O9989 Other specified diseases and conditions complicating pregnancy, childbirth and the puerperium: Secondary | ICD-10-CM | POA: Insufficient documentation

## 2013-02-08 DIAGNOSIS — L299 Pruritus, unspecified: Secondary | ICD-10-CM | POA: Insufficient documentation

## 2013-02-08 DIAGNOSIS — L988 Other specified disorders of the skin and subcutaneous tissue: Secondary | ICD-10-CM | POA: Insufficient documentation

## 2013-02-08 DIAGNOSIS — T361X1A Poisoning by cephalosporins and other beta-lactam antibiotics, accidental (unintentional), initial encounter: Secondary | ICD-10-CM | POA: Insufficient documentation

## 2013-02-08 DIAGNOSIS — O99210 Obesity complicating pregnancy, unspecified trimester: Secondary | ICD-10-CM | POA: Insufficient documentation

## 2013-02-08 DIAGNOSIS — Z87891 Personal history of nicotine dependence: Secondary | ICD-10-CM | POA: Insufficient documentation

## 2013-02-08 DIAGNOSIS — E669 Obesity, unspecified: Secondary | ICD-10-CM | POA: Insufficient documentation

## 2013-02-08 DIAGNOSIS — T361X5A Adverse effect of cephalosporins and other beta-lactam antibiotics, initial encounter: Secondary | ICD-10-CM | POA: Insufficient documentation

## 2013-02-08 DIAGNOSIS — J45909 Unspecified asthma, uncomplicated: Secondary | ICD-10-CM | POA: Insufficient documentation

## 2013-02-08 DIAGNOSIS — Z79899 Other long term (current) drug therapy: Secondary | ICD-10-CM | POA: Insufficient documentation

## 2013-02-08 DIAGNOSIS — Z792 Long term (current) use of antibiotics: Secondary | ICD-10-CM | POA: Insufficient documentation

## 2013-02-08 DIAGNOSIS — Z8709 Personal history of other diseases of the respiratory system: Secondary | ICD-10-CM | POA: Insufficient documentation

## 2013-02-08 NOTE — ED Notes (Signed)
States since this began on Saturday she has tried moisturizing with vaseline and also cocoa cocoa butter without relief

## 2013-02-08 NOTE — ED Provider Notes (Signed)
History     CSN: [id number]  Arrival date & time 02/08/13  0203   First MD Initiated Contact with Patient 02/08/13 0226      Chief Complaint  Patient presents with  . Hand Problem    (Consider location/radiation/quality/duration/timing/severity/associated sxs/prior treatment) HPI HPI Comments: Leslie Mayer is a 22 y.o. female, 6 months pregnant [facility] 05/15/13  who presents to the Emergency Department complaining of hands and perineum peeling since being started on antibiotics (Keflex). The peeling itches. Denies fever, chills.  PCP Dr. Concepcion Elk  Past Medical History  Diagnosis Date  . Asthma   . Recurrent sinus infections   . Heart murmur   . Obesity 11/23/2012  . Nausea and vomiting in pregnancy 11/23/2012    Past Surgical History  Procedure Laterality Date  . No past surgeries      Family History  Problem Relation Age of Onset  . Hypertension Other   . Cancer Other     mat great gma throat cancer  . Heart disease Paternal Grandmother   . Hypertension Paternal Grandmother     History  Substance Use Topics  . Smoking status: Former Smoker -- 1.00 packs/day    Types: Cigarettes  . Smokeless tobacco: Never Used  . Alcohol Use: No    OB History   Grav Para Term Preterm Abortions TAB SAB Ect Mult Living   1 0 0 0 0 0 0 0 0 0       Review of Systems  Constitutional: Negative for fever.       10 Systems reviewed and are negative for acute change except as noted in the HPI.  HENT: Negative for congestion.   Eyes: Negative for discharge and redness.  Respiratory: Negative for cough and shortness of breath.   Cardiovascular: Negative for chest pain.  Gastrointestinal: Negative for vomiting and abdominal pain.  Musculoskeletal: Negative for back pain.  Skin: Negative for rash.       Peeling hands and perineum  Neurological: Negative for syncope, numbness and headaches.  Psychiatric/Behavioral:       No behavior change.    Allergies  Review of patient's  allergies indicates no known allergies.  Home Medications   Current Outpatient Rx  Name  Route  Sig  Dispense  Refill  . acetaminophen (TYLENOL) 500 MG tablet   Oral   Take 1,000 mg by mouth every 6 (six) hours as needed for pain.         Marland Kitchen albuterol (PROVENTIL HFA;VENTOLIN HFA) 108 (90 BASE) MCG/ACT inhaler   Inhalation   Inhale 2 puffs into the lungs every 6 (six) hours as needed.         Marland Kitchen amoxicillin (AMOXIL) 500 MG capsule   Oral   Take 1 capsule (500 mg total) by mouth 3 (three) times daily.   21 capsule   2   . cephALEXin (KEFLEX) 500 MG capsule   Oral   Take 1 capsule (500 mg total) by mouth 4 (four) times daily.   28 capsule   0   . clotrimazole-betamethasone (LOTRISONE) cream   Topical   Apply topically 2 (two) times daily.   45 g   1   . fluconazole (DIFLUCAN) 150 MG tablet   Oral   Take 1 tablet (150 mg total) by mouth once. Take one tablet by mouth in 3 days (01/26/13)   1 tablet   0   . gentamicin (GARAMYCIN) 0.3 % ophthalmic solution   Both Eyes   Place 1 drop into  both eyes 2 (two) times daily.         Marland Kitchen HYDROcodone-acetaminophen (NORCO/VICODIN) 5-325 MG per tablet   Oral   Take 2 tablets by mouth at bedtime as needed for pain.   5 tablet   0   . HYDROcodone-acetaminophen (NORCO/VICODIN) 5-325 MG per tablet   Oral   Take 1-2 tablets by mouth every 6 (six) hours as needed for pain.   10 tablet   0   . loratadine (CLARITIN) 10 MG tablet   Oral   Take 10 mg by mouth daily.         . metroNIDAZOLE (FLAGYL) 500 MG tablet   Oral   Take 1 tablet (500 mg total) by mouth 2 (two) times daily.   14 tablet   0   . nystatin (MYCOSTATIN) powder   Topical   Apply topically 3 (three) times daily as needed.   60 g   0   . prenatal vitamin w/FE, FA (PRENATAL 1 + 1) 27-1 MG TABS   Oral   Take 1 tablet by mouth daily at 12 noon.           BP 140/84  Pulse 107  Temp(Src) 99.1 F (37.3 C) (Oral)  Resp 20  Ht 5\' 5"  (1.651 m)  Wt 304  lb (137.893 kg)  BMI 50.59 kg/m2  SpO2 98%  LMP 08/16/2012  Physical Exam  Nursing note and vitals reviewed. Constitutional: She appears well-developed and well-nourished.  Awake, alert, nontoxic appearance.  HENT:  Head: Normocephalic and atraumatic.  Eyes: EOM are normal. Pupils are equal, round, and reactive to light.  Neck: Normal range of motion. Neck supple.  Cardiovascular: Normal rate and intact distal pulses.   Pulmonary/Chest: Effort normal and breath sounds normal. She exhibits no tenderness.  Abdominal: Soft. Bowel sounds are normal. There is no tenderness. There is no rebound.  Genitourinary:  gravid  Musculoskeletal: She exhibits no tenderness.  Baseline ROM, no obvious new focal weakness.  Neurological:  Mental status and motor strength appears baseline for patient and situation.  Skin: No rash noted.  hands are peeling.Groin area is peeling.  Psychiatric: She has a normal mood and affect.    ED Course  Procedures (including critical care time)     MDM  Pregnant patent on Keflex who began peeling to the hands and perineum for 2 days. Will stop the antibiotic. Pt stable in ED with no significant deterioration in condition.The patient appears reasonably screened and/or stabilized for discharge and I doubt any other medical condition or other [facility] requiring further screening, evaluation, or treatment in the ED at this time prior to discharge.  MDM Reviewed: nursing note and vitals           Nicoletta Dress. Colon Branch, MD 02/08/13 [phone]

## 2013-02-08 NOTE — ED Notes (Signed)
Patient was seen on 01/29/13 for abscess; was given antibiotic; patient states she has been peeling for 2 days.

## 2013-02-20 ENCOUNTER — Other Ambulatory Visit: Payer: Self-pay | Admitting: Obstetrics & Gynecology

## 2013-02-20 ENCOUNTER — Ambulatory Visit (INDEPENDENT_AMBULATORY_CARE_PROVIDER_SITE_OTHER): Payer: Medicaid Other | Admitting: Women's Health

## 2013-02-20 ENCOUNTER — Encounter: Payer: Self-pay | Admitting: Women's Health

## 2013-02-20 ENCOUNTER — Ambulatory Visit (INDEPENDENT_AMBULATORY_CARE_PROVIDER_SITE_OTHER): Payer: Medicaid Other

## 2013-02-20 ENCOUNTER — Other Ambulatory Visit: Payer: Self-pay

## 2013-02-20 VITALS — BP 110/78 | Wt 307.2 lb

## 2013-02-20 DIAGNOSIS — O358XX0 Maternal care for other (suspected) fetal abnormality and damage, not applicable or unspecified: Secondary | ICD-10-CM

## 2013-02-20 DIAGNOSIS — F1920 Other psychoactive substance dependence, uncomplicated: Secondary | ICD-10-CM

## 2013-02-20 DIAGNOSIS — O99322 Drug use complicating pregnancy, second trimester: Secondary | ICD-10-CM

## 2013-02-20 DIAGNOSIS — Z3403 Encounter for supervision of normal first pregnancy, third trimester: Secondary | ICD-10-CM

## 2013-02-20 DIAGNOSIS — Z3402 Encounter for supervision of normal first pregnancy, second trimester: Secondary | ICD-10-CM

## 2013-02-20 DIAGNOSIS — Z1389 Encounter for screening for other disorder: Secondary | ICD-10-CM

## 2013-02-20 DIAGNOSIS — O2390 Unspecified genitourinary tract infection in pregnancy, unspecified trimester: Secondary | ICD-10-CM

## 2013-02-20 DIAGNOSIS — Z331 Pregnant state, incidental: Secondary | ICD-10-CM

## 2013-02-20 LAB — POCT URINALYSIS DIPSTICK
Glucose, UA: NEGATIVE
Ketones, UA: NEGATIVE

## 2013-02-20 NOTE — Progress Notes (Signed)
U/S(28+0wks)-active vtx fetus, meas c/w dates, EFW 2 lb 11 oz (57th%tile), fluid wnl, post gr 1 plac, EICF resolved, anatomy reviewed, female fetus

## 2013-02-20 NOTE — Addendum Note (Signed)
Addended by: Shawna Clamp R on: 02/20/2013 12:05 PM   Modules accepted: Orders

## 2013-02-20 NOTE — Progress Notes (Signed)
Reports good fm. Denies uc's, lof, vb, urinary frequency, urgency, hesitancy, or dysuria.  Recommended continuing claritin or switching to zyrtec for allergies.  Counseled on 47#wt gain to date- discussed healthier eating choices. Reviewed u/s report from today. Reviewed ptl s/s and fetal kick counts.  All questions answered. PN2 and UDS today. F/U in 4wks for visit.

## 2013-02-20 NOTE — Patient Instructions (Addendum)

## 2013-02-20 NOTE — Progress Notes (Signed)
Requests something for allergies.

## 2013-02-21 ENCOUNTER — Encounter: Payer: Self-pay | Admitting: Women's Health

## 2013-02-21 LAB — CBC
HCT: 32.2 % — ABNORMAL LOW (ref 36.0–46.0)
Hemoglobin: 10.7 g/dL — ABNORMAL LOW (ref 12.0–15.0)
MCV: 81.7 fL (ref 78.0–100.0)
RBC: 3.94 MIL/uL (ref 3.87–5.11)
RDW: 15.7 % — ABNORMAL HIGH (ref 11.5–15.5)
WBC: 9.9 10*3/uL (ref 4.0–10.5)

## 2013-02-21 LAB — DRUG SCREEN, URINE, NO CONFIRMATION
Amphetamine Screen, Ur: NEGATIVE
Benzodiazepines.: NEGATIVE
Marijuana Metabolite: POSITIVE — AB
Methadone: NEGATIVE
Phencyclidine (PCP): NEGATIVE
Propoxyphene: NEGATIVE

## 2013-02-21 LAB — GLUCOSE TOLERANCE, 2 HOURS W/ 1HR
Glucose, 1 hour: 147 mg/dL (ref 70–170)
Glucose, Fasting: 81 mg/dL (ref 70–99)

## 2013-02-21 LAB — ANTIBODY SCREEN: Antibody Screen: NEGATIVE

## 2013-02-21 LAB — RPR

## 2013-02-22 LAB — HSV 2 ANTIBODY, IGG: HSV 2 Glycoprotein G Ab, IgG: <0.1 IV

## 2013-03-01 LAB — US OB FOLLOW UP

## 2013-03-13 ENCOUNTER — Emergency Department (HOSPITAL_COMMUNITY)
Admission: EM | Admit: 2013-03-13 | Discharge: 2013-03-13 | Disposition: A | Discharge status: Home / Self Care | Payer: Medicaid Other | Attending: Emergency Medicine | Admitting: Emergency Medicine

## 2013-03-13 ENCOUNTER — Encounter (HOSPITAL_COMMUNITY): Payer: Self-pay | Admitting: Emergency Medicine

## 2013-03-13 DIAGNOSIS — R011 Cardiac murmur, unspecified: Secondary | ICD-10-CM | POA: Insufficient documentation

## 2013-03-13 DIAGNOSIS — R059 Cough, unspecified: Secondary | ICD-10-CM | POA: Insufficient documentation

## 2013-03-13 DIAGNOSIS — Z8619 Personal history of other infectious and parasitic diseases: Secondary | ICD-10-CM | POA: Insufficient documentation

## 2013-03-13 DIAGNOSIS — E669 Obesity, unspecified: Secondary | ICD-10-CM | POA: Insufficient documentation

## 2013-03-13 DIAGNOSIS — R05 Cough: Secondary | ICD-10-CM

## 2013-03-13 DIAGNOSIS — L293 Anogenital pruritus, unspecified: Secondary | ICD-10-CM | POA: Insufficient documentation

## 2013-03-13 DIAGNOSIS — N898 Other specified noninflammatory disorders of vagina: Secondary | ICD-10-CM

## 2013-03-13 DIAGNOSIS — J45909 Unspecified asthma, uncomplicated: Secondary | ICD-10-CM | POA: Insufficient documentation

## 2013-03-13 DIAGNOSIS — Z87891 Personal history of nicotine dependence: Secondary | ICD-10-CM | POA: Insufficient documentation

## 2013-03-13 DIAGNOSIS — Z79899 Other long term (current) drug therapy: Secondary | ICD-10-CM | POA: Insufficient documentation

## 2013-03-13 MED ORDER — FLUCONAZOLE 100 MG PO TABS
150.0000 mg | ORAL_TABLET | Freq: Once | ORAL | Status: AC
  Administered 2013-03-13: 150 mg via ORAL
  Filled 2013-03-13: qty 2

## 2013-03-13 NOTE — ED Provider Notes (Signed)
History     CSN: [id number]  Arrival date & time 03/13/13  0029   First MD Initiated Contact with Patient 03/13/13 0153      Chief Complaint  Patient presents with  . Vaginal Itching    (Consider location/radiation/quality/duration/timing/severity/associated sxs/prior treatment) HPI HPI Comments: Leslie Mayer is a 22 y.o. female, [redacted] weeks pregnant  who presents to the Emergency Department complaining of vaginal itching and a discharge. She has just finished keflex for a UTI. Now with itching and discharge from the vaginal. She was seen at [facility] a month ago and told she had poison ivy. She denies fever, chills, nausea, vomiting, shortness of breath, cough.  PCP Dr. Concepcion Elk [facility] Dr. Emelda Fear  Past Medical History  Diagnosis Date  . Asthma   . Recurrent sinus infections   . Heart murmur   . Obesity 11/23/2012  . Nausea and vomiting in pregnancy 11/23/2012    Past Surgical History  Procedure Laterality Date  . No past surgeries      Family History  Problem Relation Age of Onset  . Hypertension Other   . Cancer Other     mat great gma throat cancer  . Heart disease Paternal Grandmother   . Hypertension Paternal Grandmother     History  Substance Use Topics  . Smoking status: Former Smoker -- 1.00 packs/day    Types: Cigarettes  . Smokeless tobacco: Never Used  . Alcohol Use: No    OB History   Grav Para Term Preterm Abortions TAB SAB Ect Mult Living   1 0 0 0 0 0 0 0 0 0       Review of Systems  Constitutional: Negative for fever.       10 Systems reviewed and are negative for acute change except as noted in the HPI.  HENT: Negative for congestion.   Eyes: Negative for discharge and redness.  Respiratory: Negative for cough and shortness of breath.   Cardiovascular: Negative for chest pain.  Gastrointestinal: Negative for vomiting and abdominal pain.  Genitourinary: Positive for vaginal discharge.  Musculoskeletal: Negative for back pain.   Skin: Negative for rash.  Neurological: Negative for syncope, numbness and headaches.  Psychiatric/Behavioral:       No behavior change.    Allergies  Keflex  Home Medications   Current Outpatient Rx  Name  Route  Sig  Dispense  Refill  . albuterol (PROVENTIL HFA;VENTOLIN HFA) 108 (90 BASE) MCG/ACT inhaler   Inhalation   Inhale 2 puffs into the lungs every 6 (six) hours as needed.         . loratadine (CLARITIN) 10 MG tablet   Oral   Take 10 mg by mouth daily.         . Pediatric Multiple Vit-C-FA (FLINSTONES GUMMIES OMEGA-3 DHA PO)   Oral   Take by mouth. Advised to take 2 daily           BP 131/85  Pulse 116  Temp(Src) 98.3 F (36.8 C) (Oral)  Resp 18  Ht 5\' 4"  (1.626 m)  Wt 300 lb (136.079 kg)  BMI 51.47 kg/m2  SpO2 99%  LMP 08/16/2012  Physical Exam  Nursing note and vitals reviewed. Constitutional: She appears well-developed and well-nourished.  Awake, alert, nontoxic appearance.  HENT:  Head: Normocephalic and atraumatic.  Mouth/Throat: Oropharynx is clear and moist.  Eyes: EOM are normal. Pupils are equal, round, and reactive to light.  Neck: Normal range of motion. Neck supple.  Cardiovascular: Normal rate and intact  distal pulses.   Pulmonary/Chest: Effort normal and breath sounds normal. She exhibits no tenderness.  Abdominal: Soft. Bowel sounds are normal. There is no tenderness. There is no rebound.  Genitourinary:  Gravid uterus Vagina with yeast  Musculoskeletal: Normal range of motion. She exhibits no tenderness.  Baseline ROM, no obvious new focal weakness.  Neurological:  Mental status and motor strength appears baseline for patient and situation.  Skin: No rash noted.  Psychiatric: She has a normal mood and affect.    ED Course  Procedures (including critical care time)    1. Vaginal itching   2. Cough       MDM  Patient, [redacted] weeks pregnant,  presents with vaginal itching and yeast infection of the vagina. Given  diflucan 150 mg PO here. Pt stable in ED with no significant deterioration in condition.The patient appears reasonably screened and/or stabilized for discharge and I doubt any other medical condition or other [facility] requiring further screening, evaluation, or treatment in the ED at this time prior to discharge.  MDM Reviewed: nursing note and vitals           Nicoletta Dress. Colon Branch, MD 03/13/13 [phone]

## 2013-03-13 NOTE — ED Notes (Signed)
Patient states she has had vaginal itching for a month or two.  Patient states was seen at [facility] and told she had poison ivy.

## 2013-03-20 ENCOUNTER — Encounter: Payer: Self-pay | Admitting: Women's Health

## 2013-03-23 ENCOUNTER — Encounter: Payer: Self-pay | Admitting: Obstetrics and Gynecology

## 2013-03-27 ENCOUNTER — Ambulatory Visit (INDEPENDENT_AMBULATORY_CARE_PROVIDER_SITE_OTHER): Payer: Medicaid Other | Admitting: Obstetrics and Gynecology

## 2013-03-27 VITALS — BP 140/60 | Wt 309.6 lb

## 2013-03-27 DIAGNOSIS — O99320 Drug use complicating pregnancy, unspecified trimester: Secondary | ICD-10-CM

## 2013-03-27 DIAGNOSIS — Z1389 Encounter for screening for other disorder: Secondary | ICD-10-CM

## 2013-03-27 DIAGNOSIS — O99019 Anemia complicating pregnancy, unspecified trimester: Secondary | ICD-10-CM

## 2013-03-27 DIAGNOSIS — Z331 Pregnant state, incidental: Secondary | ICD-10-CM

## 2013-03-27 DIAGNOSIS — Z3493 Encounter for supervision of normal pregnancy, unspecified, third trimester: Secondary | ICD-10-CM

## 2013-03-27 LAB — POCT URINALYSIS DIPSTICK: Ketones, UA: NEGATIVE

## 2013-03-27 MED ORDER — PRENATAL VITAMINS 0.8 MG PO TABS: 1.0000 | ORAL_TABLET | Freq: Every day | ORAL | Status: DC

## 2013-03-27 NOTE — Progress Notes (Signed)
C/o "little bit of pain in lower back." Pt states went to [facility] x 2 weeks ago given Diflucan for yeast wants to make sure resolved.

## 2013-03-27 NOTE — Patient Instructions (Signed)
Please complete medicaid update ultrassound for growth of baby in 2 weeks.

## 2013-03-27 NOTE — Progress Notes (Signed)
Noncompliant with pnvits, hasn't gotten MA application in. Denies depression. Attended childbirth classes. Agrees to incr compliance. Vulvar exam normal, no yeast. Size>>Dates, will u/s for efw, growth.

## 2013-04-03 ENCOUNTER — Encounter (HOSPITAL_COMMUNITY): Payer: Self-pay | Admitting: *Deleted

## 2013-04-03 ENCOUNTER — Emergency Department (HOSPITAL_COMMUNITY)
Admission: EM | Admit: 2013-04-03 | Discharge: 2013-04-04 | Disposition: A | Discharge status: Home / Self Care | Payer: Medicaid Other | Attending: Emergency Medicine | Admitting: Emergency Medicine

## 2013-04-03 DIAGNOSIS — M545 Low back pain, unspecified: Secondary | ICD-10-CM | POA: Insufficient documentation

## 2013-04-03 DIAGNOSIS — O2390 Unspecified genitourinary tract infection in pregnancy, unspecified trimester: Secondary | ICD-10-CM | POA: Insufficient documentation

## 2013-04-03 DIAGNOSIS — Z79899 Other long term (current) drug therapy: Secondary | ICD-10-CM | POA: Insufficient documentation

## 2013-04-03 DIAGNOSIS — Z8709 Personal history of other diseases of the respiratory system: Secondary | ICD-10-CM | POA: Insufficient documentation

## 2013-04-03 DIAGNOSIS — N390 Urinary tract infection, site not specified: Secondary | ICD-10-CM | POA: Insufficient documentation

## 2013-04-03 DIAGNOSIS — J45909 Unspecified asthma, uncomplicated: Secondary | ICD-10-CM | POA: Insufficient documentation

## 2013-04-03 DIAGNOSIS — R011 Cardiac murmur, unspecified: Secondary | ICD-10-CM | POA: Insufficient documentation

## 2013-04-03 DIAGNOSIS — L293 Anogenital pruritus, unspecified: Secondary | ICD-10-CM | POA: Insufficient documentation

## 2013-04-03 DIAGNOSIS — B379 Candidiasis, unspecified: Secondary | ICD-10-CM

## 2013-04-03 DIAGNOSIS — Z87891 Personal history of nicotine dependence: Secondary | ICD-10-CM | POA: Insufficient documentation

## 2013-04-03 DIAGNOSIS — R630 Anorexia: Secondary | ICD-10-CM | POA: Insufficient documentation

## 2013-04-03 DIAGNOSIS — O9989 Other specified diseases and conditions complicating pregnancy, childbirth and the puerperium: Secondary | ICD-10-CM | POA: Insufficient documentation

## 2013-04-03 DIAGNOSIS — E669 Obesity, unspecified: Secondary | ICD-10-CM | POA: Insufficient documentation

## 2013-04-03 NOTE — ED Notes (Signed)
Pt is [redacted] weeks pregnant.. Having back pain and vaginal itching , No bleeding.

## 2013-04-03 NOTE — ED Provider Notes (Signed)
History    This chart was scribed for Lyanne Co, MD, by Frederik Pear, ED scribe. The patient was seen in room APA01/APA01 and the patient's care was started at 2304.   CSN: [id number] Arrival date & time 04/03/13  2304  None    Chief Complaint  Patient presents with  . Back Pain  . Routine Prenatal Visit   (Consider location/radiation/quality/duration/timing/severity/associated sxs/prior Treatment) The history is provided by the patient and medical records. No language interpreter was used.   HPI Comments: Leslie Mayer is a 22 y.o. female who is currently [redacted] weeks pregnant and presents to the Emergency Department complaining of sudden onset, intermittent lower back pain that began yesterday and lasts for approximately 10 minutes with associated vaginal itching. Denies vaginal bleeding or loss of fluids. She reports this is her first pregnancy, and it has been normal thus far without any complications. Due date is 08/25. She reports normal fluid and appetite over the last 2 days. She reports she has intermittently smoked cigarettes throughout the pregnancy, but denies ETOH use. No h/o of DM.   [facility]N is [facility].   Past Medical History  Diagnosis Date  . Asthma   . Recurrent sinus infections   . Heart murmur   . Obesity 11/23/2012  . Nausea and vomiting in pregnancy 11/23/2012  . Pregnant    Past Surgical History  Procedure Laterality Date  . No past surgeries     Family History  Problem Relation Age of Onset  . Hypertension Other   . Cancer Other     mat great gma throat cancer  . Heart disease Paternal Grandmother   . Hypertension Paternal Grandmother    History  Substance Use Topics  . Smoking status: Former Smoker -- 1.00 packs/day    Types: Cigarettes  . Smokeless tobacco: Never Used  . Alcohol Use: No   OB History   Grav Para Term Preterm Abortions TAB SAB Ect Mult Living   1 0 0 0 0 0 0 0 0 0      Review of Systems A complete 10 system review of  systems was obtained and all systems are negative except as noted in the HPI and PMH.  Allergies  Keflex  Home Medications   Current Outpatient Rx  Name  Route  Sig  Dispense  Refill  . albuterol (PROVENTIL HFA;VENTOLIN HFA) 108 (90 BASE) MCG/ACT inhaler   Inhalation   Inhale 2 puffs into the lungs every 6 (six) hours as needed.         . loratadine (CLARITIN) 10 MG tablet   Oral   Take 10 mg by mouth daily.         . Prenatal Multivit-Min-Fe-FA (PRENATAL VITAMINS) 0.8 MG tablet   Oral   Take 1 tablet by mouth daily.   30 tablet   12    BP 110/64  Pulse 112  Temp(Src) 98.2 F (36.8 C) (Oral)  Resp 24  Ht 5\' 4"  (1.626 m)  Wt 308 lb (139.708 kg)  BMI 52.84 kg/m2  SpO2 100%  LMP 08/16/2012 Physical Exam  Nursing note and vitals reviewed. Constitutional: She is oriented to person, place, and time. She appears well-developed and well-nourished. No distress.  HENT:  Head: Normocephalic and atraumatic.  Eyes: EOM are normal.  Neck: Normal range of motion.  Cardiovascular: Normal rate, regular rhythm and normal heart sounds.   Pulmonary/Chest: Effort normal and breath sounds normal.  Abdominal: Soft. She exhibits no distension. There is no tenderness.  There is no rebound.  No abdominal tenderness.  Genitourinary:  Chaperoned exam. G1B0.  Uterus consistent with dates.  Cervix closed. No effacement.   Musculoskeletal: Normal range of motion.  Neurological: She is alert and oriented to person, place, and time.  Skin: Skin is warm and dry.  Psychiatric: She has a normal mood and affect. Judgment normal.    ED Course  Procedures (including critical care time)  DIAGNOSTIC STUDIES: Oxygen Saturation is 100% on room air, normal by my interpretation.    COORDINATION OF CARE:  23:46- Discussed planned course of treatment with the patient, including a UA , Percocet, and observation in the ED, who is agreeable at this time.  Labs Reviewed  URINALYSIS, ROUTINE W  REFLEX MICROSCOPIC - Abnormal; Notable for the following:    APPearance HAZY (*)    Specific Gravity, Urine >1.030 (*)    Ketones, ur TRACE (*)    Protein, ur TRACE (*)    Leukocytes, UA SMALL (*)    All other components within normal limits  URINE MICROSCOPIC-ADD ON - Abnormal; Notable for the following:    Squamous Epithelial / LPF MANY (*)    Bacteria, UA MANY (*)    Crystals CA OXALATE CRYSTALS (*)    All other components within normal limits  URINE CULTURE   No results found. 1. Urinary tract infection   2. Yeast infection     MDM  Patient has no signs of suggest preterm labor.  Cervix is closed.  No contractions noted on monitor.  Fetal heart rates look good.  Stable dose of Diflucan here in the emergency department as there is yeast on her urine.  This is likely yeast infection given her complaints of vaginal itching.  She also has poor blood cells in her urine and many bacteria.  Much this may be contaminant however her urine will be covered.  PCP/OB follow  I personally performed the services described in this documentation, which was scribed in my presence. The recorded information has been reviewed and is accurate.     Lyanne Co, MD 04/04/13 [phone]

## 2013-04-04 LAB — URINE MICROSCOPIC-ADD ON

## 2013-04-04 LAB — URINALYSIS, ROUTINE W REFLEX MICROSCOPIC
Bilirubin Urine: NEGATIVE
Glucose, UA: NEGATIVE mg/dL
Hgb urine dipstick: NEGATIVE
pH: 6 (ref 5.0–8.0)

## 2013-04-04 MED ORDER — OXYCODONE-ACETAMINOPHEN 5-325 MG PO TABS
1.0000 | ORAL_TABLET | Freq: Once | ORAL | Status: AC
  Administered 2013-04-04: 1 via ORAL
  Filled 2013-04-04: qty 1

## 2013-04-04 MED ORDER — CIPROFLOXACIN HCL 500 MG PO TABS: 500.0000 mg | ORAL_TABLET | Freq: Two times a day (BID) | ORAL | Status: DC

## 2013-04-04 MED ORDER — FLUCONAZOLE 100 MG PO TABS
150.0000 mg | ORAL_TABLET | Freq: Once | ORAL | Status: AC
  Administered 2013-04-04: 150 mg via ORAL
  Filled 2013-04-04: qty 2

## 2013-04-04 MED ORDER — CIPROFLOXACIN HCL 250 MG PO TABS
500.0000 mg | ORAL_TABLET | Freq: Once | ORAL | Status: AC
  Administered 2013-04-04: 500 mg via ORAL
  Filled 2013-04-04: qty 2

## 2013-04-05 LAB — URINE CULTURE

## 2013-04-10 ENCOUNTER — Other Ambulatory Visit: Payer: Self-pay

## 2013-04-10 ENCOUNTER — Other Ambulatory Visit: Payer: Self-pay | Admitting: Obstetrics & Gynecology

## 2013-04-10 ENCOUNTER — Ambulatory Visit (INDEPENDENT_AMBULATORY_CARE_PROVIDER_SITE_OTHER): Payer: Medicaid Other

## 2013-04-10 ENCOUNTER — Ambulatory Visit (INDEPENDENT_AMBULATORY_CARE_PROVIDER_SITE_OTHER): Payer: Medicaid Other | Admitting: Obstetrics and Gynecology

## 2013-04-10 VITALS — BP 122/56 | Wt 311.0 lb

## 2013-04-10 DIAGNOSIS — O359XX0 Maternal care for (suspected) fetal abnormality and damage, unspecified, not applicable or unspecified: Secondary | ICD-10-CM

## 2013-04-10 DIAGNOSIS — O2390 Unspecified genitourinary tract infection in pregnancy, unspecified trimester: Secondary | ICD-10-CM

## 2013-04-10 DIAGNOSIS — O99019 Anemia complicating pregnancy, unspecified trimester: Secondary | ICD-10-CM

## 2013-04-10 DIAGNOSIS — O99320 Drug use complicating pregnancy, unspecified trimester: Secondary | ICD-10-CM

## 2013-04-10 DIAGNOSIS — Z3403 Encounter for supervision of normal first pregnancy, third trimester: Secondary | ICD-10-CM

## 2013-04-10 DIAGNOSIS — Z1389 Encounter for screening for other disorder: Secondary | ICD-10-CM

## 2013-04-10 DIAGNOSIS — Z331 Pregnant state, incidental: Secondary | ICD-10-CM

## 2013-04-10 DIAGNOSIS — N898 Other specified noninflammatory disorders of vagina: Secondary | ICD-10-CM

## 2013-04-10 LAB — POCT WET PREP (WET MOUNT)
Trichomonas Wet Prep HPF POC: NEGATIVE
WBC, Wet Prep HPF POC: NEGATIVE

## 2013-04-10 LAB — POCT URINALYSIS DIPSTICK: Glucose, UA: NEGATIVE

## 2013-04-10 NOTE — Patient Instructions (Addendum)
Fetal Movement Counts Patient Name: __________________________________________________ Patient Due Date: ____________________ Performing a fetal movement count is highly recommended in high-risk pregnancies, but it is good for every pregnant woman to do. Your caregiver may ask you to start counting fetal movements at 28 weeks of the pregnancy. Fetal movements often increase:  After eating a full meal.  After physical activity.  After eating or drinking something sweet or cold.  At rest. Pay attention to when you feel the baby is most active. This will help you notice a pattern of your baby's sleep and wake cycles and what factors contribute to an increase in fetal movement. It is important to perform a fetal movement count at the same time each day when your baby is normally most active.  HOW TO COUNT FETAL MOVEMENTS 1. Find a quiet and comfortable area to sit or lie down on your left side. Lying on your left side provides the best blood and oxygen circulation to your baby. 2. Write down the day and time on a sheet of paper or in a journal. 3. Start counting kicks, flutters, swishes, rolls, or jabs in a 2 hour period. You should feel at least 10 movements within 2 hours. 4. If you do not feel 10 movements in 2 hours, wait 2 3 hours and count again. Look for a change in the pattern or not enough counts in 2 hours. SEEK MEDICAL CARE IF:  You feel less than 10 counts in 2 hours, tried twice.  There is no movement in over an hour.  The pattern is changing or taking longer each day to reach 10 counts in 2 hours.  You feel the baby is not moving as he or she usually does. Date: ____________ Movements: ____________ Start time: ____________ Finish time: ____________  Date: ____________ Movements: ____________ Start time: ____________ Finish time: ____________ Date: ____________ Movements: ____________ Start time: ____________ Finish time: ____________ Date: ____________ Movements: ____________  Start time: ____________ Finish time: ____________ Date: ____________ Movements: ____________ Start time: ____________ Finish time: ____________ Date: ____________ Movements: ____________ Start time: ____________ Finish time: ____________ Date: ____________ Movements: ____________ Start time: ____________ Finish time: ____________ Date: ____________ Movements: ____________ Start time: ____________ Finish time: ____________  Date: ____________ Movements: ____________ Start time: ____________ Finish time: ____________ Date: ____________ Movements: ____________ Start time: ____________ Finish time: ____________ Date: ____________ Movements: ____________ Start time: ____________ Finish time: ____________ Date: ____________ Movements: ____________ Start time: ____________ Finish time: ____________ Date: ____________ Movements: ____________ Start time: ____________ Finish time: ____________ Date: ____________ Movements: ____________ Start time: ____________ Finish time: ____________ Date: ____________ Movements: ____________ Start time: ____________ Finish time: ____________  Date: ____________ Movements: ____________ Start time: ____________ Finish time: ____________ Date: ____________ Movements: ____________ Start time: ____________ Finish time: ____________ Date: ____________ Movements: ____________ Start time: ____________ Finish time: ____________ Date: ____________ Movements: ____________ Start time: ____________ Finish time: ____________ Date: ____________ Movements: ____________ Start time: ____________ Finish time: ____________ Date: ____________ Movements: ____________ Start time: ____________ Finish time: ____________ Date: ____________ Movements: ____________ Start time: ____________ Finish time: ____________  Date: ____________ Movements: ____________ Start time: ____________ Finish time: ____________ Date: ____________ Movements: ____________ Start time: ____________ Finish time:  ____________ Date: ____________ Movements: ____________ Start time: ____________ Finish time: ____________ Date: ____________ Movements: ____________ Start time: ____________ Finish time: ____________ Date: ____________ Movements: ____________ Start time: ____________ Finish time: ____________ Date: ____________ Movements: ____________ Start time: ____________ Finish time: ____________ Date: ____________ Movements: ____________ Start time: ____________ Finish time: ____________  Date: ____________ Movements: ____________ Start time: ____________ Finish   time: ____________ Date: ____________ Movements: ____________ Start time: ____________ Finish time: ____________ Date: ____________ Movements: ____________ Start time: ____________ Finish time: ____________ Date: ____________ Movements: ____________ Start time: ____________ Finish time: ____________ Date: ____________ Movements: ____________ Start time: ____________ Finish time: ____________ Date: ____________ Movements: ____________ Start time: ____________ Finish time: ____________ Date: ____________ Movements: ____________ Start time: ____________ Finish time: ____________  Date: ____________ Movements: ____________ Start time: ____________ Finish time: ____________ Date: ____________ Movements: ____________ Start time: ____________ Finish time: ____________ Date: ____________ Movements: ____________ Start time: ____________ Finish time: ____________ Date: ____________ Movements: ____________ Start time: ____________ Finish time: ____________ Date: ____________ Movements: ____________ Start time: ____________ Finish time: ____________ Date: ____________ Movements: ____________ Start time: ____________ Finish time: ____________ Date: ____________ Movements: ____________ Start time: ____________ Finish time: ____________  Date: ____________ Movements: ____________ Start time: ____________ Finish time: ____________ Date: ____________ Movements:  ____________ Start time: ____________ Finish time: ____________ Date: ____________ Movements: ____________ Start time: ____________ Finish time: ____________ Date: ____________ Movements: ____________ Start time: ____________ Finish time: ____________ Date: ____________ Movements: ____________ Start time: ____________ Finish time: ____________ Date: ____________ Movements: ____________ Start time: ____________ Finish time: ____________ Date: ____________ Movements: ____________ Start time: ____________ Finish time: ____________  Date: ____________ Movements: ____________ Start time: ____________ Finish time: ____________ Date: ____________ Movements: ____________ Start time: ____________ Finish time: ____________ Date: ____________ Movements: ____________ Start time: ____________ Finish time: ____________ Date: ____________ Movements: ____________ Start time: ____________ Finish time: ____________ Date: ____________ Movements: ____________ Start time: ____________ Finish time: ____________ Date: ____________ Movements: ____________ Start time: ____________ Finish time: ____________ Document Released: 10/07/2006 Document Revised: 08/24/2012 Document Reviewed: 07/04/2012 [employer] Patient Information 2014 [employer].  

## 2013-04-10 NOTE — Progress Notes (Signed)
EFW= 6#0oz./58% for 35 wks, AFI= 15.3cm/55% for 35+0 wks, vertex lie, active, female fetus, post plac. , gr #3, cx not seen, FHT=127 bpm, all anat reviewed appears wnl, EICF no longer seen, resolved.

## 2013-04-10 NOTE — Progress Notes (Signed)
C/o lower back pain. Wants followup for proof of cure for yeast , seen at [facility] for yeast. Pt aware she should go to [facility] in the future. Has transportation.

## 2013-04-18 ENCOUNTER — Encounter: Payer: Self-pay | Admitting: Advanced Practice Midwife

## 2013-04-18 ENCOUNTER — Ambulatory Visit (INDEPENDENT_AMBULATORY_CARE_PROVIDER_SITE_OTHER): Payer: Medicaid Other | Admitting: Advanced Practice Midwife

## 2013-04-18 VITALS — BP 100/60 | Wt 309.0 lb

## 2013-04-18 DIAGNOSIS — Z3403 Encounter for supervision of normal first pregnancy, third trimester: Secondary | ICD-10-CM

## 2013-04-18 DIAGNOSIS — O99019 Anemia complicating pregnancy, unspecified trimester: Secondary | ICD-10-CM

## 2013-04-18 DIAGNOSIS — Z1389 Encounter for screening for other disorder: Secondary | ICD-10-CM

## 2013-04-18 DIAGNOSIS — Z331 Pregnant state, incidental: Secondary | ICD-10-CM

## 2013-04-18 DIAGNOSIS — F1920 Other psychoactive substance dependence, uncomplicated: Secondary | ICD-10-CM

## 2013-04-18 DIAGNOSIS — F1910 Other psychoactive substance abuse, uncomplicated: Secondary | ICD-10-CM

## 2013-04-18 DIAGNOSIS — O99320 Drug use complicating pregnancy, unspecified trimester: Secondary | ICD-10-CM

## 2013-04-18 LAB — POCT URINALYSIS DIPSTICK: Glucose, UA: NEGATIVE

## 2013-04-18 NOTE — Patient Instructions (Addendum)

## 2013-04-18 NOTE — Progress Notes (Signed)
C/C BACK PAIN.

## 2013-04-18 NOTE — Progress Notes (Signed)
No c/o at this time except musculoskeletal back pain.  Requested something "stronger than flexeril".  Long discussion about narcotic use in pregnancy and how using them long term for normal pains of pregnancy is not a good idea. Other, safer, remedies for back pain discussed and given in writing.  Asked to go ahead and schedule her IOL for 05/16/13.  Explained that our group does not offer IOL without a medical reason, and postdates is not until 41 weeks. Routine questions about pregnancy answered.  F/U in 1 weeks for LROB/u/s for EFW/AFI d/t size >dates

## 2013-04-19 LAB — DRUG SCREEN, URINE, NO CONFIRMATION
Amphetamine Screen, Ur: NEGATIVE
Barbiturate Quant, Ur: NEGATIVE
Benzodiazepines.: NEGATIVE
Cocaine Metabolites: NEGATIVE

## 2013-04-22 ENCOUNTER — Encounter (HOSPITAL_COMMUNITY): Payer: Self-pay | Admitting: *Deleted

## 2013-04-22 ENCOUNTER — Inpatient Hospital Stay (HOSPITAL_COMMUNITY)
Admission: AD | Admit: 2013-04-22 | Discharge: 2013-04-22 | Disposition: A | Discharge status: Home / Self Care | Payer: Medicaid Other | Source: Ambulatory Visit | Attending: Obstetrics & Gynecology | Admitting: Obstetrics & Gynecology

## 2013-04-22 DIAGNOSIS — R102 Pelvic and perineal pain: Secondary | ICD-10-CM

## 2013-04-22 DIAGNOSIS — O99322 Drug use complicating pregnancy, second trimester: Secondary | ICD-10-CM

## 2013-04-22 DIAGNOSIS — B951 Streptococcus, group B, as the cause of diseases classified elsewhere: Secondary | ICD-10-CM

## 2013-04-22 DIAGNOSIS — O2602 Excessive weight gain in pregnancy, second trimester: Secondary | ICD-10-CM

## 2013-04-22 DIAGNOSIS — Z3403 Encounter for supervision of normal first pregnancy, third trimester: Secondary | ICD-10-CM

## 2013-04-22 DIAGNOSIS — O99891 Other specified diseases and conditions complicating pregnancy: Secondary | ICD-10-CM | POA: Insufficient documentation

## 2013-04-22 DIAGNOSIS — B379 Candidiasis, unspecified: Secondary | ICD-10-CM

## 2013-04-22 DIAGNOSIS — N899 Noninflammatory disorder of vagina, unspecified: Secondary | ICD-10-CM

## 2013-04-22 DIAGNOSIS — N949 Unspecified condition associated with female genital organs and menstrual cycle: Secondary | ICD-10-CM | POA: Insufficient documentation

## 2013-04-22 LAB — URINALYSIS, ROUTINE W REFLEX MICROSCOPIC
Nitrite: NEGATIVE
Specific Gravity, Urine: >1.03 — ABNORMAL HIGH (ref 1.005–1.030)
pH: 6 (ref 5.0–8.0)

## 2013-04-22 LAB — URINE MICROSCOPIC-ADD ON

## 2013-04-22 LAB — OB RESULTS CONSOLE GC/CHLAMYDIA
Chlamydia: NEGATIVE
Gonorrhea: NEGATIVE

## 2013-04-22 LAB — WET PREP, GENITAL

## 2013-04-22 MED ORDER — FLUCONAZOLE 150 MG PO TABS: 150.0000 mg | ORAL_TABLET | Freq: Once | ORAL | Status: DC

## 2013-04-22 MED ORDER — FLUCONAZOLE 150 MG PO TABS
150.0000 mg | ORAL_TABLET | Freq: Once | ORAL | Status: AC
  Administered 2013-04-22: 150 mg via ORAL
  Filled 2013-04-22: qty 1

## 2013-04-22 NOTE — MAU Note (Signed)
Pt reports having vaginal irritation and vaginal pressure. Reports a brownish discharge that now is yellow. Treated for yeast infection and BV 2 weeks ago.

## 2013-04-22 NOTE — MAU Provider Note (Signed)
History     CSN: [id number]  Arrival date and time: 04/22/13 1619   None     Chief Complaint  Patient presents with  . Vaginal Pain   HPI Comments: Pt is a 22 yo G1 @ [redacted]w[redacted]d here for persistent chronic pelvic pain throughout pregnancy and vaginal irritation. States was treated 2 weeks ago for BV and yeast. Took meds at the same time. Irritation improved initially but has worsened in the last 4 days. Having significant itching and irritation. Pelvic pain pt states has been todl is from round ligament and that it won't go away but she is still frustrated by it.  No fevers, chills. abd pain. No ctx, vb, LOF. +FM.   Vaginal Pain    OB History   Grav Para Term Preterm Abortions TAB SAB Ect Mult Living   1 0 0 0 0 0 0 0 0 0       Past Medical History  Diagnosis Date  . Asthma   . Recurrent sinus infections   . Heart murmur   . Obesity 11/23/2012  . Nausea and vomiting in pregnancy 11/23/2012  . Pregnant     Past Surgical History  Procedure Laterality Date  . No past surgeries      Family History  Problem Relation Age of Onset  . Hypertension Other   . Cancer Other     mat great gma throat cancer  . Heart disease Paternal Grandmother   . Hypertension Paternal Grandmother     History  Substance Use Topics  . Smoking status: Former Smoker -- 1.00 packs/day    Types: Cigarettes  . Smokeless tobacco: Never Used  . Alcohol Use: No    Allergies:  Allergies  Allergen Reactions  . Keflex (Cephalexin)     Hands peeled after taking this antibiotic    Prescriptions prior to admission  Medication Sig Dispense Refill  . albuterol (PROVENTIL HFA;VENTOLIN HFA) 108 (90 BASE) MCG/ACT inhaler Inhale 2 puffs into the lungs every 6 (six) hours as needed.      . ciprofloxacin (CIPRO) 500 MG tablet Take 1 tablet (500 mg total) by mouth every 12 (twelve) hours.  10 tablet  0  . cyclobenzaprine (FLEXERIL) 10 MG tablet Take 10 mg by mouth 3 (three) times daily as needed for muscle  spasms.      . Prenatal Multivit-Min-Fe-FA (PRENATAL VITAMINS) 0.8 MG tablet Take 1 tablet by mouth daily.  30 tablet  12    Review of Systems  Constitutional: Negative.   Eyes: Negative.   Respiratory: Negative.   Cardiovascular: Negative.   Gastrointestinal: Negative.   Genitourinary: Negative.    Physical Exam   Blood pressure 117/66, pulse 95, temperature 98 F (36.7 C), resp. rate 18, last menstrual period 08/16/2012.  Physical Exam  Constitutional: She is oriented to person, place, and time. She appears well-developed and well-nourished.  HENT:  Head: Normocephalic and atraumatic.  Eyes: Pupils are equal, round, and reactive to light.  Neck: Neck supple.  Cardiovascular: Normal rate, regular rhythm and normal heart sounds.   Respiratory: Effort normal and breath sounds normal.  GI: Soft. Bowel sounds are normal.  Musculoskeletal: Normal range of motion.  Neurological: She is alert and oriented to person, place, and time.  Skin: Skin is warm and dry.  Psychiatric: She has a normal mood and affect.   GU: significant watery discharge at the introitus with erythema and irritation and thicker white discharge in the vagina. Normal cervix. No CMT. Gravid uterus  SVE:  0/thick/high  MAU Course  Procedures  MDM Pt with significant vaginal discharge Will send wet prep and GC/Chl - treat OTO with diflucan also while here  Assessment and Plan    1) vaginal irritation - exam and wet prep consistent with yeast - UA neg - GC/chl sent - treated with oto dose here adn rx to go home and repeat in 5 days as is repetetive  2) pelvic pressure/pain - chronic. No change from rest of pregnancy SVE 0/thick/high - reassurance given - can f/u with regular provider  3) FWB Cat I tracing   Return precautions discussed. F/u with FT next week as scheduled.   Foy Mungia L 04/22/2013, 4:54 PM

## 2013-04-23 LAB — URINE CULTURE

## 2013-04-24 LAB — GC/CHLAMYDIA PROBE AMP: GC Probe RNA: NEGATIVE

## 2013-04-27 ENCOUNTER — Other Ambulatory Visit: Payer: Self-pay | Admitting: Advanced Practice Midwife

## 2013-04-27 ENCOUNTER — Encounter: Payer: Self-pay | Admitting: Obstetrics & Gynecology

## 2013-04-27 ENCOUNTER — Ambulatory Visit (INDEPENDENT_AMBULATORY_CARE_PROVIDER_SITE_OTHER): Payer: Medicaid Other

## 2013-04-27 ENCOUNTER — Ambulatory Visit (INDEPENDENT_AMBULATORY_CARE_PROVIDER_SITE_OTHER): Payer: Medicaid Other | Admitting: Obstetrics & Gynecology

## 2013-04-27 VITALS — BP 130/60 | Wt 307.0 lb

## 2013-04-27 DIAGNOSIS — O26849 Uterine size-date discrepancy, unspecified trimester: Secondary | ICD-10-CM

## 2013-04-27 DIAGNOSIS — Z331 Pregnant state, incidental: Secondary | ICD-10-CM

## 2013-04-27 DIAGNOSIS — Z3403 Encounter for supervision of normal first pregnancy, third trimester: Secondary | ICD-10-CM

## 2013-04-27 DIAGNOSIS — Z1389 Encounter for screening for other disorder: Secondary | ICD-10-CM

## 2013-04-27 LAB — POCT URINALYSIS DIPSTICK
Glucose, UA: NEGATIVE
Ketones, UA: NEGATIVE

## 2013-04-27 NOTE — Progress Notes (Signed)
BP weight and urine results all reviewed and noted. Patient reports good fetal movement, denies any bleeding and no rupture of membranes symptoms or regular contractions. Patient is without complaints. All questions were answered.  

## 2013-04-27 NOTE — Progress Notes (Signed)
FOLLOW-UP U/S TODAY.

## 2013-04-27 NOTE — Patient Instructions (Signed)
Epidural Risks and Benefits The continuous putting in (infusion) of local anesthetics through a long, narrow, hollow plastic tube (catheter)/needle into the lower (lumbar) area of your spine is commonly called an epidural. This means outside the covering of the spinal cord. The epidural catheter is placed in the space on the outside of the membrane that covers the spinal cord. The anesthetic medicine numbs the nerves of the spinal cord in the epidural space. There is also a spinal/epidural anesthetic using two needles and a catheter. The medication is first placed in the spinal canal. Then that needle is removed and a catheter is placed in the epidural space through the second needle for continuous anesthesia. This seems to be the most popular type of regional anesthesia used now. This is sometimes given for pain management to women who are giving birth. Spinal and epidural anesthesia are called regional anesthesia because they numb a certain region of the body. While it is an effective pain management tool, some reasons not to use this include:  Restricted mobility: The tubes and monitors connected to you do not allow for much moving around.  Increased likelihood of bladder catheterization, oxytocin administration, and internal monitoring. This means a tube (catheter) may have to be put into the bladder to drain the urine. Uterine contractions can become weaker and less frequent. They also may have a higher use of oxytocin than mothers not having regional anesthesia.  Increased likelihood of operative delivery: This includes the use of or need for forceps, vacuum extractor, episiotomy, or cesarean delivery. When the dose is too large, or when it sinks down into the "tailbone" (sacral) region of the body, the perineum and the birth canal (vagina) are anesthetized. Anesthetic is injected into this area late in labor to deaden all sensation. When it "accidentally" happens earlier in labor, the muscles of the  pelvic floor are relaxed too early. This interferes with the normal flexion and rotation of the baby's head as it passes through the birth canal. This interference can lead to abnormal presentations that are more dangerous for the baby.  Must use an automatic blood pressure cuff throughout labor. This is a cuff that automatically takes your blood pressure at regular intervals. SHORT TERM MATERNAL RISKS  Dural puncture - The dura is one of the membranes surrounding the spinal cord. If the anesthetic medication gets into the spinal canal through a dural puncture, it can result in a spinal anesthetic and spinal headache. Spinal headaches are treated with an epidural blood patch to cover the punctured area.  Low blood pressure (hypotension) - Nearly one third of women with an epidural will develop low blood pressure. The ways that patients must lay during the epidural can make this worse. Their position is limited because they will be unable to move their legs easily for the time of the anesthetic. Low blood pressure is also a risk for the baby. If the baby does not get enough oxygen from the mom's blood, it can result in an emergency Cesarean section. This means the baby is delivered by an operation through a cut by the surgeon (incision) on the belly of the mother.  Nausea, vomiting, and prolonged shivering.  Prolonged labor - With large doses of anesthetic medication, the patient loses the desire and the ability to bear down and push. This results in an increased use of forceps and vacuum extractions, compared to women having unmedicated deliveries.  Uneven, incomplete or non-existent pain relief. Sometimes the epidural does not work well and   additional medications may be needed for pain relief.  Difficulty breathing well or paralysis if the level of anesthesia goes too high in the spine.  Convulsions - If the anesthetic agent accidentally is injected into a blood vessel it can cause convulsions and  loss of consciousness.  Toxic drug reactions.  Septic meningitis - An abscess can form at the site where the epidural catheter is placed. If this spreads into the spinal canal it can cause meningitis.  Allergic reaction - This causes blood pressure to become too low and other medications and fluids must be given to bring the blood pressure up. Also rashes and difficulty breathing may develop.  Cardiac arrest - This is rare but real threat to the life of the mother and baby.  Fever is common.  Itching that is easily treated.  Spinal hematoma. LONG TERM MATERNAL RISKS  Neurological complications - A nerve problem called Horner's syndrome can develop with epidural anesthesia for vaginal delivery. It is impossible to predict which patients will develop a Horner's syndrome. Even the nerves to the face can be blocked, temporarily or permanently. Tremors and shakes can occur.  Paresthesia ("pins and needles"). This is a feeling that comes from inflammation of a nerve.  Dizziness and fainting can become a problem after epidurals. This is usually only for a couple of days. RISKS TO BABY  Direct drug toxicity.  Fetal distress, abnormal fetal heart rate (FHR) (can lead to emergency cesarean). This is especially true if the anesthetic gets into the mother's blood stream or too much medication is put into the epidural. REASONS NOT TO HAVE EPIDURAL ANESTHESIA  Increased costs.  The mother has a low blood pressure.  There are blood clotting problems.  A brain tumor is present.  There is an infection in the blood stream.  A skin infection at the needle site.  A tattoo at the needle site. BENEFITS  Regional anesthesia is the most effective pain relief for labor and delivery.  It is the best anesthetic for preeclampsia and eclampsia.  There is better pain control after delivery (vaginal or cesarean).  When done correctly, no medication gets to the baby.  Sooner ambulation after  delivery.  It can be left in place during all of labor.  You can be awake during a Cesarean delivery and see the baby immediately after delivery. AFTER THE PROCEDURE   You will be kept in bed for several hours to prevent headaches.  You will be kept in bed until your legs are no longer numb and it is safe to walk.  The length of time you spend in the hospital will depend on the type of surgery or procedure you have had.  The epidural catheter is removed after you no longer need it for pain. HOME CARE INSTRUCTIONS   Do not drive or operate any kind of machinery for at least 24 hours. Make sure there is someone to drive you home.  Do not drink alcohol for at least 24 hours after the anesthesia.  Do not make important decisions for at least 24 hours after the anesthesia.  Drink lots of fluids.  Return to your normal diet.  Keep all your postoperative appointments as scheduled. SEEK IMMEDIATE MEDICAL CARE IF:  You develop a fever or temperature over 98.6 F (37 C).  You have a persistent headache.  You develop dizziness, fainting or lightheadedness.  You develop weakness, numbness or tingling in your arms or legs.  You have a skin rash.  You   have difficulty breathing  You have a stiff neck with or without stiff back.  You develop chest pain. Document Released: 09/07/2005 Document Revised: 11/30/2011 Document Reviewed: 10/15/2008 Exit[employer] Patient Information 2014 [employer].  

## 2013-04-27 NOTE — Progress Notes (Signed)
U/S(37+3wks)-vtx active fetus EFW 7 lb 1oz, (56th%tile), fluid wnl AFI=12.5cm, post gr 2 plac

## 2013-05-04 ENCOUNTER — Encounter: Payer: Medicaid Other | Admitting: Advanced Practice Midwife

## 2013-05-10 ENCOUNTER — Ambulatory Visit (INDEPENDENT_AMBULATORY_CARE_PROVIDER_SITE_OTHER): Payer: Medicaid Other | Admitting: Advanced Practice Midwife

## 2013-05-10 VITALS — BP 108/70 | Wt 309.0 lb

## 2013-05-10 DIAGNOSIS — O99019 Anemia complicating pregnancy, unspecified trimester: Secondary | ICD-10-CM

## 2013-05-10 DIAGNOSIS — Z331 Pregnant state, incidental: Secondary | ICD-10-CM

## 2013-05-10 DIAGNOSIS — Z1389 Encounter for screening for other disorder: Secondary | ICD-10-CM

## 2013-05-10 LAB — POCT URINALYSIS DIPSTICK
Blood, UA: NEGATIVE
Glucose, UA: NEGATIVE
Nitrite, UA: NEGATIVE

## 2013-05-10 NOTE — Progress Notes (Signed)
C/o "a lot of vaginal pressure"

## 2013-05-10 NOTE — Progress Notes (Signed)
No c/o at this time. Cx anterior/BBOW  No contractions.  Requests IOL to be scheduled.05/23/13 at 0700 if undelivered Routine questions about pregnancy answered.  F/U in 1 weeks for LROB.

## 2013-05-16 ENCOUNTER — Inpatient Hospital Stay (HOSPITAL_COMMUNITY)
Admission: AD | Admit: 2013-05-16 | Discharge: 2013-05-16 | Disposition: A | Discharge status: Home / Self Care | Payer: Medicaid Other | Source: Ambulatory Visit | Attending: Obstetrics and Gynecology | Admitting: Obstetrics and Gynecology

## 2013-05-16 ENCOUNTER — Encounter (HOSPITAL_COMMUNITY): Payer: Self-pay | Admitting: Obstetrics and Gynecology

## 2013-05-16 DIAGNOSIS — O479 False labor, unspecified: Secondary | ICD-10-CM | POA: Insufficient documentation

## 2013-05-16 DIAGNOSIS — O471 False labor at or after 37 completed weeks of gestation: Secondary | ICD-10-CM

## 2013-05-16 LAB — POCT FERN TEST: POCT Fern Test: NEGATIVE

## 2013-05-16 NOTE — MAU Provider Note (Signed)
Chief Complaint:  Labor Eval   First Provider Initiated Contact with Patient 05/16/13 1410      HPI: Leslie Mayer is a 22 y.o. G1P0000 at [redacted]w[redacted]d who presents to maternity admissions reporting contractions about every 5 min. Unsure if LOF but no gush or need for pad.  Denies vaginal bleeding. Good fetal movement. Cx 3/70/bulging at office visit last week.   Pregnancy Course: PNC at [facility]. Significant for obesity, large weight gain, THC on UDS, GBS bacteruria.  Past Medical History: Past Medical History  Diagnosis Date  . Asthma   . Recurrent sinus infections   . Heart murmur   . Obesity 11/23/2012  . Nausea and vomiting in pregnancy 11/23/2012  . Pregnant     Past obstetric history: OB History  Gravida Para Term Preterm AB SAB TAB Ectopic Multiple Living  1 0 0 0 0 0 0 0 0 0     # Outcome Date GA Lbr Len/2nd Weight Sex Delivery Anes PTL Lv  1 CUR               Past Surgical History: Past Surgical History  Procedure Laterality Date  . No past surgeries       Family History: Family History  Problem Relation Age of Onset  . Hypertension Other   . Cancer Other     mat great gma throat cancer  . Heart disease Paternal Grandmother   . Hypertension Paternal Grandmother     Social History: History  Substance Use Topics  . Smoking status: Former Smoker -- 1.00 packs/day    Types: Cigarettes  . Smokeless tobacco: Never Used  . Alcohol Use: No    Allergies:  Allergies  Allergen Reactions  . Keflex [Cephalexin]     Hands peeled after taking this antibiotic    Meds:  Prescriptions prior to admission  Medication Sig Dispense Refill  . acetaminophen (TYLENOL) 160 MG/5ML liquid Take 15 mg/kg by mouth every 4 (four) hours as needed for fever.      Marland Kitchen albuterol (PROVENTIL HFA;VENTOLIN HFA) 108 (90 BASE) MCG/ACT inhaler Inhale 2 puffs into the lungs every 6 (six) hours as needed.      . Prenatal Multivit-Min-Fe-FA (PRENATAL VITAMINS) 0.8 MG tablet Take 1 tablet by mouth  daily.  30 tablet  12    ROS: Pertinent findings in history of present illness.  Physical Exam  Blood pressure 122/58, pulse 115, temperature 97.9 F (36.6 C), temperature source Oral, resp. rate 20, height 5' 5.5" (1.664 m), weight 141.704 kg (312 lb 6.4 oz), last menstrual period 08/16/2012. GENERAL: Well-developed, well-nourished female in no acute distress.  HEENT: normocephalic HEART: normal rate RESP: normal effort ABDOMEN: Soft, non-tender, gravid appropriate for gestational age, EFW 8.5# EXTREMITIES: Nontender, no edema NEURO: alert and oriented SPECULUM EXAM:per Joseph Berkshire PA> no pooling  Dilation: 3 Effacement (%): 80 Cervical Position: Posterior Station: -3 Presentation: Vertex Exam by:: Harvin Hazel, RN   FHT:  Baseline 140 , moderate variability, accelerations present, no decelerations Contractions:irregular   Labs: Results for orders placed during the hospital encounter of 05/16/13 (from the past 24 hour(s))  POCT FERN TEST     Status: None   Collection Time    05/16/13  2:08 PM      Result Value Range   POCT Fern Test Negative = intact amniotic membranes      Imaging:  US Ob Follow Up  04/27/2013   FOLLOW UP SONOGRAM   Leslie Mayer is in the office for a follow up  sonogram for  size>dates.  She is a 22 y.o. year old G1P0000 with Estimated Date of Delivery: 05/15/13  by early ultrasound now at  [redacted]w[redacted]d weeks gestation.  GESTATION: SINGLETON  PRESENTATION: cephalic  FETAL ACTIVITY:          Heart rate         147 bpm           The fetus is active.  AMNIOTIC FLUID: The amniotic fluid volume is  normal, 12.5 cm.  PLACENTA LOCALIZATION:  posterior GRADE 2  CERVIX: Not visualized  ADNEXA:  Not visualized  GESTATIONAL AGE AND  BIOMETRICS:  Gestational criteria: Estimated Date of Delivery: 05/15/13 by early  ultrasound now at [redacted]w[redacted]d  Previous Scans:  GESTATIONAL SAC            mm          weeks  CROWN RUMP LENGTH            mm          weeks  NUCHAL TRANSLUCENCY             mm           BIPARIETAL DIAMETER            cm          weeks  HEAD CIRCUMFERENCE            cm          weeks  ABDOMINAL CIRCUMFERENCE           33.44 cm         37+2 weeks  FEMUR LENGTH           7.26 cm         37+1 weeks                                                           AVERAGE EGA(BY THIS SCAN):   37+2 weeks                                                 ESTIMATED FETAL WEIGHT:        3205  grams, 56 % ANATOMICAL SURVEY                                                                             COMMENTS CEREBRAL VENTRICLES no    CHOROID PLEXUS no    CEREBELLUM no    CISTERNA MAGNA no    NUCHAL REGION no    ORBITS no    NASAL BONE no    NOSE/LIP no    FACIAL PROFILE no    4 CHAMBERED HEART no    OUTFLOW TRACTS no    DIAPHRAGM yes normal   STOMACH yes normal   RENAL REGION yes normal   BLADDER yes normal   CORD INSERTION no  3 VESSEL CORD yes normal   SPINE yes normal   ARMS/HANDS no    LEGS/FEET yes normal   GENITALIA no  female        SUSPECTED ABNORMALITIES:  no  QUALITY OF SCAN: satisfactory  TECHNICIAN COMMENTS:    U/S(37+3wks)-vtx active fetus EFW 7 lb 1oz, (56th%tile), fluid wnl  AFI=12.5cm, post gr 2 plac     A copy of this report including all images has been saved and backed up to  a second source for retrieval if needed. All measures and details of the  anatomical scan, placentation, fluid volume and pelvic anatomy are  contained in that report.  Chari Manning  Clinical Impression and recommendations:  I have reviewed the sonogram results above, combined with the patient's  current clinical course, below are my impressions and any appropriate  recommendations for management based on the sonographic findings. . 1.  G1P0000 Estimated Date of Delivery: 05/15/13 by  early ultrasound and  confirmed by today's sonographic dating 2.  Normal fetal sonographic findings, specifically normal growth  percentile, all other findings are normal as well 3.  Normal general sonographic findings  Recommend  routine prenatal care based on this sonogram or as clinically  indicated  EURE,LUTHER H 04/27/2013 11:03 AM    04/27/2013 9:55 AM       MAU Course: UCs not tracing well but palpate irregular, mild. Ambulate at 1415. 1555: cx unchanged per RN, UCs diminished. Dilation: 3 Effacement (%): 50 Cervical Position: Posterior (To pt.'s right) Station: -3 Presentation: Vertex Exam by:: Sarajane Marek, RNC Assessment: 1. False labor after 37 completed weeks of gestation   Category 1 FHR  Plan: Discharge home  Labor precautions and fetal kick counts    Medication List         acetaminophen 160 MG/5ML liquid  Commonly known as:  TYLENOL  Take 15 mg/kg by mouth every 4 (four) hours as needed for fever.     albuterol 108 (90 BASE) MCG/ACT inhaler  Commonly known as:  PROVENTIL HFA;VENTOLIN HFA  Inhale 2 puffs into the lungs every 6 (six) hours as needed.     Prenatal Vitamins 0.8 MG tablet  Take 1 tablet by mouth daily.       Follow-up Information   Follow up with [facility]. (Keep your scheduld appointment)    Contact information:   [address] [place] [address] [phone]      Danae Orleans, CNM 05/16/2013 2:11 PM

## 2013-05-16 NOTE — MAU Note (Signed)
Patient states she is having lower abdominal pain but unsure if contractions. Reports good fetal movement, no bleeding or leaking.

## 2013-05-16 NOTE — MAU Note (Signed)
Patient presents to MAU with c/o contractions every 5 minutes since last evening. Reports noted clear fluid when up to BR yesterday morning; today she reports fluid is yellow.  Denies HSV, VB. Reports +FM. Desires epidural. Reports SVE 3cm last Wednesday.

## 2013-05-17 ENCOUNTER — Encounter: Payer: Self-pay | Admitting: Obstetrics & Gynecology

## 2013-05-17 ENCOUNTER — Ambulatory Visit (INDEPENDENT_AMBULATORY_CARE_PROVIDER_SITE_OTHER): Payer: Medicaid Other | Admitting: Obstetrics & Gynecology

## 2013-05-17 ENCOUNTER — Telehealth (HOSPITAL_COMMUNITY): Payer: Self-pay | Admitting: *Deleted

## 2013-05-17 VITALS — BP 130/60 | Wt 312.0 lb

## 2013-05-17 DIAGNOSIS — Z331 Pregnant state, incidental: Secondary | ICD-10-CM

## 2013-05-17 DIAGNOSIS — Z1389 Encounter for screening for other disorder: Secondary | ICD-10-CM

## 2013-05-17 DIAGNOSIS — O99019 Anemia complicating pregnancy, unspecified trimester: Secondary | ICD-10-CM

## 2013-05-17 LAB — POCT URINALYSIS DIPSTICK
Glucose, UA: NEGATIVE
Ketones, UA: NEGATIVE
Protein, UA: NEGATIVE

## 2013-05-17 LAB — OB RESULTS CONSOLE GBS: GBS: POSITIVE

## 2013-05-17 NOTE — Telephone Encounter (Signed)
Preadmission screen  

## 2013-05-17 NOTE — Progress Notes (Signed)
Went to [employer] a lot of pressure and pain. States dilated 3 CM.

## 2013-05-17 NOTE — Progress Notes (Signed)
BP weight and urine results all reviewed and noted. Patient reports good fetal movement, denies any bleeding and no rupture of membranes symptoms or regular contractions. Patient is without complaints. All questions were answered.  

## 2013-05-18 ENCOUNTER — Encounter (HOSPITAL_COMMUNITY): Payer: Self-pay | Admitting: Anesthesiology

## 2013-05-18 ENCOUNTER — Encounter (HOSPITAL_COMMUNITY): Payer: Self-pay | Admitting: *Deleted

## 2013-05-18 ENCOUNTER — Inpatient Hospital Stay (HOSPITAL_COMMUNITY)
Admission: AD | Admit: 2013-05-18 | Discharge: 2013-05-21 | DRG: 775 | Disposition: A | Discharge status: Home / Self Care | Payer: Medicaid Other | Source: Ambulatory Visit | Attending: Obstetrics & Gynecology | Admitting: Obstetrics & Gynecology

## 2013-05-18 ENCOUNTER — Inpatient Hospital Stay (HOSPITAL_COMMUNITY): Payer: Medicaid Other | Admitting: Anesthesiology

## 2013-05-18 DIAGNOSIS — O26899 Other specified pregnancy related conditions, unspecified trimester: Secondary | ICD-10-CM

## 2013-05-18 DIAGNOSIS — O99322 Drug use complicating pregnancy, second trimester: Secondary | ICD-10-CM

## 2013-05-18 DIAGNOSIS — Z3403 Encounter for supervision of normal first pregnancy, third trimester: Secondary | ICD-10-CM

## 2013-05-18 DIAGNOSIS — Z22330 Carrier of Group B streptococcus: Secondary | ICD-10-CM

## 2013-05-18 DIAGNOSIS — O99892 Other specified diseases and conditions complicating childbirth: Secondary | ICD-10-CM | POA: Diagnosis present

## 2013-05-18 DIAGNOSIS — B951 Streptococcus, group B, as the cause of diseases classified elsewhere: Secondary | ICD-10-CM

## 2013-05-18 DIAGNOSIS — O2602 Excessive weight gain in pregnancy, second trimester: Secondary | ICD-10-CM

## 2013-05-18 LAB — CBC
MCH: 25.9 pg — ABNORMAL LOW (ref 26.0–34.0)
Platelets: 301 10*3/uL (ref 150–400)
RBC: 4.28 MIL/uL (ref 3.87–5.11)
WBC: 10.4 10*3/uL (ref 4.0–10.5)

## 2013-05-18 MED ORDER — LIDOCAINE HCL (PF) 1 % IJ SOLN
INTRAMUSCULAR | Status: DC | PRN
  Administered 2013-05-18 (×4): 4 mL

## 2013-05-18 MED ORDER — OXYCODONE-ACETAMINOPHEN 5-325 MG PO TABS
1.0000 | ORAL_TABLET | ORAL | Status: DC | PRN
  Administered 2013-05-19: 1 via ORAL
  Filled 2013-05-18: qty 1

## 2013-05-18 MED ORDER — LACTATED RINGERS IV SOLN
INTRAVENOUS | Status: DC
  Administered 2013-05-18: 22:00:00 via INTRAVENOUS
  Administered 2013-05-19: 125 mL/h via INTRAVENOUS
  Administered 2013-05-19: via INTRAVENOUS

## 2013-05-18 MED ORDER — EPHEDRINE 5 MG/ML INJ
10.0000 mg | INTRAVENOUS | Status: DC | PRN
  Filled 2013-05-18: qty 2
  Filled 2013-05-18: qty 4

## 2013-05-18 MED ORDER — CITRIC ACID-SODIUM CITRATE 334-500 MG/5ML PO SOLN: 30.0000 mL | ORAL | Status: DC | PRN

## 2013-05-18 MED ORDER — PHENYLEPHRINE 40 MCG/ML (10ML) SYRINGE FOR IV PUSH (FOR BLOOD PRESSURE SUPPORT)
80.0000 ug | PREFILLED_SYRINGE | INTRAVENOUS | Status: DC | PRN
  Filled 2013-05-18: qty 2

## 2013-05-18 MED ORDER — FENTANYL 2.5 MCG/ML BUPIVACAINE 1/10 % EPIDURAL INFUSION (WH - ANES)
14.0000 mL/h | INTRAMUSCULAR | Status: DC | PRN
  Administered 2013-05-18 – 2013-05-19 (×3): 14 mL/h via EPIDURAL
  Filled 2013-05-18 (×3): qty 125

## 2013-05-18 MED ORDER — ACETAMINOPHEN 325 MG PO TABS: 650.0000 mg | ORAL_TABLET | ORAL | Status: DC | PRN

## 2013-05-18 MED ORDER — IBUPROFEN 600 MG PO TABS
600.0000 mg | ORAL_TABLET | Freq: Four times a day (QID) | ORAL | Status: DC | PRN
  Administered 2013-05-19: 600 mg via ORAL
  Filled 2013-05-18: qty 1

## 2013-05-18 MED ORDER — PENICILLIN G POTASSIUM 5000000 UNITS IJ SOLR
5.0000 10*6.[IU] | Freq: Once | INTRAMUSCULAR | Status: AC
  Administered 2013-05-18: 5 10*6.[IU] via INTRAVENOUS
  Filled 2013-05-18: qty 5

## 2013-05-18 MED ORDER — LIDOCAINE HCL (PF) 1 % IJ SOLN
30.0000 mL | INTRAMUSCULAR | Status: AC | PRN
  Administered 2013-05-19: 30 mL via SUBCUTANEOUS
  Filled 2013-05-18 (×3): qty 30

## 2013-05-18 MED ORDER — OXYTOCIN 40 UNITS IN LACTATED RINGERS INFUSION - SIMPLE MED
62.5000 mL/h | INTRAVENOUS | Status: DC
  Administered 2013-05-19: 62.5 mL/h via INTRAVENOUS

## 2013-05-18 MED ORDER — LACTATED RINGERS IV SOLN
500.0000 mL | INTRAVENOUS | Status: DC | PRN
  Administered 2013-05-19: 500 mL via INTRAVENOUS

## 2013-05-18 MED ORDER — FENTANYL CITRATE 0.05 MG/ML IJ SOLN: 50.0000 ug | INTRAMUSCULAR | Status: DC | PRN

## 2013-05-18 MED ORDER — OXYTOCIN BOLUS FROM INFUSION: 500.0000 mL | INTRAVENOUS | Status: DC

## 2013-05-18 MED ORDER — FLEET ENEMA 7-19 GM/118ML RE ENEM: 1.0000 | ENEMA | RECTAL | Status: DC | PRN

## 2013-05-18 MED ORDER — EPHEDRINE 5 MG/ML INJ
10.0000 mg | INTRAVENOUS | Status: DC | PRN
  Filled 2013-05-18: qty 2

## 2013-05-18 MED ORDER — PHENYLEPHRINE 40 MCG/ML (10ML) SYRINGE FOR IV PUSH (FOR BLOOD PRESSURE SUPPORT)
80.0000 ug | PREFILLED_SYRINGE | INTRAVENOUS | Status: DC | PRN
  Filled 2013-05-18: qty 2
  Filled 2013-05-18: qty 5

## 2013-05-18 MED ORDER — LACTATED RINGERS IV SOLN
500.0000 mL | Freq: Once | INTRAVENOUS | Status: AC
  Administered 2013-05-18: 500 mL via INTRAVENOUS

## 2013-05-18 MED ORDER — ONDANSETRON HCL 4 MG/2ML IJ SOLN: 4.0000 mg | Freq: Four times a day (QID) | INTRAMUSCULAR | Status: DC | PRN

## 2013-05-18 MED ORDER — DIPHENHYDRAMINE HCL 50 MG/ML IJ SOLN
12.5000 mg | INTRAMUSCULAR | Status: DC | PRN
  Administered 2013-05-18 – 2013-05-19 (×2): 12.5 mg via INTRAVENOUS
  Filled 2013-05-18: qty 1

## 2013-05-18 MED ORDER — PENICILLIN G POTASSIUM 5000000 UNITS IJ SOLR
2.5000 10*6.[IU] | INTRAVENOUS | Status: DC
  Administered 2013-05-19 (×4): 2.5 10*6.[IU] via INTRAVENOUS
  Filled 2013-05-18 (×7): qty 2.5

## 2013-05-18 NOTE — MAU Note (Signed)
Pt off EFM awaiting transfer,

## 2013-05-18 NOTE — Anesthesia Preprocedure Evaluation (Signed)
Anesthesia Evaluation  Patient identified by MRN, date of birth, ID band Patient awake    Reviewed: Allergy & Precautions, H&P , NPO status , Patient's Chart, lab work & pertinent test results, reviewed documented beta blocker date and time   History of Anesthesia Complications Negative for: history of anesthetic complications  Airway Mallampati: I TM Distance: >3 FB Neck ROM: full    Dental  (+) Teeth Intact,    Pulmonary asthma (no inhaler use during pregnancy) ,  breath sounds clear to auscultation        Cardiovascular negative cardio ROS  Rhythm:regular Rate:Normal     Neuro/Psych negative neurological ROS  negative psych ROS   GI/Hepatic negative GI ROS, Neg liver ROS,   Endo/Other  Morbid obesity  Renal/GU negative Renal ROS  negative genitourinary   Musculoskeletal   Abdominal   Peds  Hematology negative hematology ROS (+)   Anesthesia Other Findings   Reproductive/Obstetrics (+) Pregnancy                           Anesthesia Physical Anesthesia Plan  ASA: III  Anesthesia Plan: Epidural   Post-op Pain Management:    Induction:   Airway Management Planned:   Additional Equipment:   Intra-op Plan:   Post-operative Plan:   Informed Consent: I have reviewed the patients History and Physical, chart, labs and discussed the procedure including the risks, benefits and alternatives for the proposed anesthesia with the patient or authorized representative who has indicated his/her understanding and acceptance.     Plan Discussed with:   Anesthesia Plan Comments:         Anesthesia Quick Evaluation

## 2013-05-18 NOTE — Anesthesia Procedure Notes (Signed)
Epidural Patient location during procedure: OB Start time: 05/18/2013 11:09 PM  Staffing Performed by: anesthesiologist   Preanesthetic Checklist Completed: patient identified, site marked, surgical consent, pre-op evaluation, timeout performed, IV checked, risks and benefits discussed and monitors and equipment checked  Epidural Patient position: sitting Prep: site prepped and draped and DuraPrep Patient monitoring: continuous pulse ox and blood pressure Approach: midline Injection technique: LOR air  Needle:  Needle type: Tuohy  Needle gauge: 17 G Needle length: 9 cm and 9 Needle insertion depth: 8 cm Catheter type: closed end flexible Catheter size: 19 Gauge Catheter at skin depth: 13 cm Test dose: negative  Assessment Events: blood not aspirated, injection not painful, no injection resistance, negative IV test and no paresthesia  Additional Notes Discussed risk of headache, infection, bleeding, nerve injury and failed or incomplete block.  Patient voices understanding and wishes to proceed.  Epidural placed easily on first attempt.  No paresthesia.  Patient tolerated procedure well with no apparent complications.  A.Buffie Herne, MD Reason for block:procedure for pain

## 2013-05-18 NOTE — MAU Note (Signed)
Patient states she is having contractions every 3-4 minutes. Denies bleeding or leaking. Reports good fetal movement.  

## 2013-05-18 NOTE — H&P (Signed)
Leslie Mayer is a 22 y.o. female G1P0000 with IUP at [redacted]w[redacted]d presenting for contractions. Pt states she has been having irregular, every 3-6 minutes contractions, associated with none vaginal bleeding.  Membranes are intact, with active fetal movement.   [facility]are at [facility] since 8 wks  Prenatal History/Complications:  none  Past Medical History: Past Medical History  Diagnosis Date  . Asthma   . Recurrent sinus infections   . Heart murmur   . Obesity 11/23/2012  . Nausea and vomiting in pregnancy 11/23/2012  . Pregnant     Past Surgical History: Past Surgical History  Procedure Laterality Date  . No past surgeries      Obstetrical History: OB History   Grav Para Term Preterm Abortions TAB SAB Ect Mult Living   1 0 0 0 0 0 0 0 0 0         Social History: History   Social History  . Marital Status: Single    Spouse Name: N/A    Number of Children: N/A  . Years of Education: N/A   Social History Main Topics  . Smoking status: Former Smoker -- 1.00 packs/day    Types: Cigarettes  . Smokeless tobacco: Never Used  . Alcohol Use: No  . Drug Use: No     Comment: quit  . Sexual Activity: Yes    Birth Control/ Protection: None   Other Topics Concern  . None   Social History Narrative  . None    Family History: Family History  Problem Relation Age of Onset  . Hypertension Other   . Cancer Other     mat great gma throat cancer  . Heart disease Paternal Grandmother   . Hypertension Paternal Grandmother     Allergies: Allergies  Allergen Reactions  . Keflex [Cephalexin]     Hands peeled after taking this antibiotic    Prescriptions prior to admission  Medication Sig Dispense Refill  . acetaminophen (TYLENOL) 160 MG/5ML liquid Take 15 mg/kg by mouth every 4 (four) hours as needed for fever.      Marland Kitchen albuterol (PROVENTIL HFA;VENTOLIN HFA) 108 (90 BASE) MCG/ACT inhaler Inhale 2 puffs into the lungs every 6 (six) hours as needed.      . Prenatal Multivit-Min-Fe-FA  (PRENATAL VITAMINS) 0.8 MG tablet Take 1 tablet by mouth daily.  30 tablet  12    Review of Systems   Constitutional: Negative for fever and chills Eyes: Negative for visual disturbances Respiratory: Negative for shortness of breath, dyspnea Cardiovascular: Negative for chest pain or palpitations  Gastrointestinal: Negative for vomiting, diarrhea and constipation Genitourinary: Negative for dysuria and urgency Musculoskeletal: Negative for back pain, joint pain, myalgias  Neurological: Negative for dizziness and headaches   Blood pressure 121/79, pulse 100, temperature 97.4 F (36.3 C), temperature source Oral, resp. rate 20, last menstrual period 08/16/2012, SpO2 100.00%. General appearance: alert, cooperative and no distress Lungs: clear to auscultation bilaterally Heart: regular rate and rhythm Abdomen: soft, non-tender; bowel sounds normal Extremities: Homans sign is negative, no sign of DVT DTR's 2+ Pelvic:  6/80/-2 Presentation: cephalic Fetal monitoringBaseline: 140 bpm, Variability: Good {> 6 bpm), Accelerations: Reactive and Decelerations: Absent Uterine activity q 3-5 minutes  Dilation: 6 Exam by:: Cres-DishmonCNM   Prenatal labs: ABO, Rh: O/Positive/-- (02/26 0000) Antibody: NEG (06/02 1004) Rubella:   RPR: NON REAC (06/02 1004)  HBsAg: Negative (02/26 0000)  HIV: NON REACTIVE (06/02 1004)  GBS: Positive (08/27 0000)  2 hr Glucola 81/147/107 Genetic screening  normal Anatomy US  normal   No results found for this or any previous visit (from the past 24 hour(s)).  Assessment: Leslie Mayer is a 22 y.o. G1P0000 with an IUP at [redacted]w[redacted]d presenting for active labor  Plan: GBS prophylaxis  Expectant management  CRESENZO-DISHMAN,Dia Donate 05/18/2013, 8:54 PM

## 2013-05-18 NOTE — H&P (Signed)
Attestation of Attending Supervision of Advanced Practitioner (PA/CNM/NP): Evaluation and management procedures were performed by the Advanced Practitioner under my supervision and collaboration.  I have reviewed the Advanced Practitioner's note and chart, and I agree with the management and plan.  Atzin Buchta, MD, FACOG Attending Obstetrician & Gynecologist Faculty Practice, [facility]  

## 2013-05-19 ENCOUNTER — Encounter (HOSPITAL_COMMUNITY): Payer: Self-pay | Admitting: *Deleted

## 2013-05-19 DIAGNOSIS — O9989 Other specified diseases and conditions complicating pregnancy, childbirth and the puerperium: Secondary | ICD-10-CM

## 2013-05-19 LAB — TYPE AND SCREEN
ABO/RH(D): O POS
Antibody Screen: NEGATIVE

## 2013-05-19 LAB — ABO/RH: ABO/RH(D): O POS

## 2013-05-19 LAB — MRSA PCR SCREENING: MRSA by PCR: NEGATIVE

## 2013-05-19 LAB — RPR: RPR Ser Ql: NONREACTIVE

## 2013-05-19 MED ORDER — IBUPROFEN 600 MG PO TABS
600.0000 mg | ORAL_TABLET | Freq: Four times a day (QID) | ORAL | Status: DC
  Administered 2013-05-20 – 2013-05-21 (×8): 600 mg via ORAL
  Filled 2013-05-19 (×8): qty 1

## 2013-05-19 MED ORDER — LANOLIN HYDROUS EX OINT: TOPICAL_OINTMENT | CUTANEOUS | Status: DC | PRN

## 2013-05-19 MED ORDER — SIMETHICONE 80 MG PO CHEW: 80.0000 mg | CHEWABLE_TABLET | ORAL | Status: DC | PRN

## 2013-05-19 MED ORDER — ONDANSETRON HCL 4 MG PO TABS: 4.0000 mg | ORAL_TABLET | ORAL | Status: DC | PRN

## 2013-05-19 MED ORDER — TERBUTALINE SULFATE 1 MG/ML IJ SOLN: 0.2500 mg | Freq: Once | INTRAMUSCULAR | Status: DC | PRN

## 2013-05-19 MED ORDER — ZOLPIDEM TARTRATE 5 MG PO TABS: 5.0000 mg | ORAL_TABLET | Freq: Every evening | ORAL | Status: DC | PRN

## 2013-05-19 MED ORDER — DIPHENHYDRAMINE HCL 25 MG PO CAPS: 25.0000 mg | ORAL_CAPSULE | Freq: Four times a day (QID) | ORAL | Status: DC | PRN

## 2013-05-19 MED ORDER — BENZOCAINE-MENTHOL 20-0.5 % EX AERO
1.0000 "application " | INHALATION_SPRAY | CUTANEOUS | Status: DC | PRN
  Administered 2013-05-20: 1 via TOPICAL
  Filled 2013-05-19: qty 56

## 2013-05-19 MED ORDER — MEASLES, MUMPS & RUBELLA VAC ~~LOC~~ INJ
0.5000 mL | INJECTION | Freq: Once | SUBCUTANEOUS | Status: DC
  Filled 2013-05-19: qty 0.5

## 2013-05-19 MED ORDER — WITCH HAZEL-GLYCERIN EX PADS: 1.0000 "application " | MEDICATED_PAD | CUTANEOUS | Status: DC | PRN

## 2013-05-19 MED ORDER — ONDANSETRON HCL 4 MG/2ML IJ SOLN: 4.0000 mg | INTRAMUSCULAR | Status: DC | PRN

## 2013-05-19 MED ORDER — DIBUCAINE 1 % RE OINT: 1.0000 "application " | TOPICAL_OINTMENT | RECTAL | Status: DC | PRN

## 2013-05-19 MED ORDER — TETANUS-DIPHTH-ACELL PERTUSSIS 5-2.5-18.5 LF-MCG/0.5 IM SUSP
0.5000 mL | Freq: Once | INTRAMUSCULAR | Status: AC
  Administered 2013-05-21: 0.5 mL via INTRAMUSCULAR
  Filled 2013-05-19: qty 0.5

## 2013-05-19 MED ORDER — OXYCODONE-ACETAMINOPHEN 5-325 MG PO TABS
1.0000 | ORAL_TABLET | ORAL | Status: DC | PRN
  Administered 2013-05-20 – 2013-05-21 (×7): 1 via ORAL
  Filled 2013-05-19 (×7): qty 1

## 2013-05-19 MED ORDER — PRENATAL MULTIVITAMIN CH
1.0000 | ORAL_TABLET | Freq: Every day | ORAL | Status: DC
  Administered 2013-05-20 – 2013-05-21 (×2): 1 via ORAL
  Filled 2013-05-19 (×2): qty 1

## 2013-05-19 MED ORDER — SENNOSIDES-DOCUSATE SODIUM 8.6-50 MG PO TABS
2.0000 | ORAL_TABLET | Freq: Every day | ORAL | Status: DC
  Administered 2013-05-19: 2 via ORAL

## 2013-05-19 MED ORDER — OXYTOCIN 40 UNITS IN LACTATED RINGERS INFUSION - SIMPLE MED
1.0000 m[IU]/min | INTRAVENOUS | Status: DC
  Administered 2013-05-19: 2 m[IU]/min via INTRAVENOUS
  Filled 2013-05-19: qty 1000

## 2013-05-19 NOTE — Progress Notes (Signed)
Spoke with Junious Dresser, infection control nurse, instructed to obtain PCR and to obtain results before making plan of care reguarding  Precautions.

## 2013-05-19 NOTE — Progress Notes (Signed)
   Leslie Mayer is a 22 y.o. G1P0000 at [redacted]w[redacted]d  admitted for active labor  Subjective: Comfortable with epidural  Objective: BP 126/74  Pulse 129  Temp(Src) 97.4 F (36.3 C) (Oral)  Resp 18  Ht 5' 5.5" (1.664 m)  Wt 141.522 kg (312 lb)  BMI 51.11 kg/m2  SpO2 99%  LMP 08/16/2012    FHT:  FHR: 140 bpm, variability: moderate,  accelerations:  Present,  decelerations:  Absent UC:   Rare;  AROM with light mec; IUPC placed SVE:   Dilation: 7 Effacement (%): 90 Station: -2 Exam by:: Drenda Freeze CNM   Labs: Lab Results  Component Value Date   WBC 10.4 05/18/2013   HGB 11.1* 05/18/2013   HCT 33.5* 05/18/2013   MCV 78.3 05/18/2013   PLT 301 05/18/2013    Assessment / Plan: Protracted active phase Will augment with pitocin Labor: Progressing normally Fetal Wellbeing:  Category I Pain Control:  Epidural Anticipated MOD:  NSVD  CRESENZO-DISHMAN,Londyn Hotard 05/19/2013, 4:56 AM

## 2013-05-19 NOTE — Progress Notes (Signed)
Patient ID: Leslie Mayer, female   DOB: 11/20/1990, 22 y.o.   MRN: [id number]  S. Comfortable with epidural O. VSS, AF     FHR-140, category 1     Pit at 10     CVX-rim/-2/90     CTX- q40min A/P. 40 1/2 weeks, SOL- expectant management

## 2013-05-19 NOTE — Progress Notes (Signed)
Cheryle Dark is a 22 y.o. G1P0000 at [redacted]w[redacted]d admitted for active labor  Subjective: having regular contractions. comfortable   Objective: BP 115/62  Pulse 111  Temp(Src) 97.7 F (36.5 C) (Oral)  Resp 18  Ht 5' 5.5" (1.664 m)  Wt 141.522 kg (312 lb)  BMI 51.11 kg/m2  SpO2 99%  LMP 08/16/2012 I/O last 3 completed shifts: In: -  Out: 1525 [Urine:1525]    FHT:  FHR: 130 bpm, variability: moderate,  accelerations:  Present,  decelerations:  Absent UC:   regular, every 2-3 minutes SVE:   Dilation: 7 Effacement (%): 90 Station: -2 Exam by:: felkel,rn/dr Darnella Zeiter  Labs: Lab Results  Component Value Date   WBC 10.4 05/18/2013   HGB 11.1* 05/18/2013   HCT 33.5* 05/18/2013   MCV 78.3 05/18/2013   PLT 301 05/18/2013    Assessment / Plan: Augmentation of labor, progressing well  Labor: Progressing normally Preeclampsia:  n/a Fetal Wellbeing:  Category I Pain Control:  Epidural I/D:  GBS+ on penicillin Anticipated MOD:  NSVD  Jacquelin Hawking, MD 05/19/2013, 9:50 AM

## 2013-05-19 NOTE — Progress Notes (Signed)
Count correct with dr. Reola Calkins

## 2013-05-19 NOTE — Progress Notes (Signed)
Leslie Mayer is a 22 y.o. G1P0000 at [redacted]w[redacted]d admitted for active labor  Subjective: Comfortable with epidural, not feeling ctx  Objective: BP 120/54  Pulse 99  Temp(Src) 97.7 F (36.5 C) (Oral)  Resp 18  Ht 5' 5.5" (1.664 m)  Wt 141.522 kg (312 lb)  BMI 51.11 kg/m2  SpO2 99%  LMP 08/16/2012     FHT:  FHR: 130 bpm, variability: moderate,  accelerations:  Present,  decelerations:  Present a few prolonged variables, some pseudosinusoidal pattern UC:   None seen on monitor SVE:   Dilation: 7 Effacement (%): 90 Station: -2 Exam by:: Leslie Mayer CNM  Labs: Lab Results  Component Value Date   WBC 10.4 05/18/2013   HGB 11.1* 05/18/2013   HCT 33.5* 05/18/2013   MCV 78.3 05/18/2013   PLT 301 05/18/2013    Assessment / Plan: Spontaneous labor, progressing normally, consider AROM at next check once has had PCN for at least 4 hrs (was completed infusion at 11:30pm)  Labor: Progressing normally Preeclampsia:  n/a Fetal Wellbeing:  Category II Pain Control:  Epidural I/D:  GBS positive on PCN Anticipated MOD:  NSVD  Levert Feinstein 05/19/2013, 12:18 AM

## 2013-05-20 LAB — CBC
MCH: 25.7 pg — ABNORMAL LOW (ref 26.0–34.0)
MCHC: 33.1 g/dL (ref 30.0–36.0)
Platelets: 249 10*3/uL (ref 150–400)
RBC: 3.35 MIL/uL — ABNORMAL LOW (ref 3.87–5.11)
RDW: 18.6 % — ABNORMAL HIGH (ref 11.5–15.5)

## 2013-05-20 NOTE — Anesthesia Postprocedure Evaluation (Signed)
  Anesthesia Post-op Note  Patient: Leslie Mayer  Procedure(s) Performed: * No procedures listed *  Patient Location: Mother/Baby  Anesthesia Type:Epidural  Level of Consciousness: awake  Airway and Oxygen Therapy: Patient Spontanous Breathing  Post-op Pain: mild  Post-op Assessment: Patient's Cardiovascular Status Stable and Respiratory Function Stable  Post-op Vital Signs: stable  Complications: No apparent anesthesia complications

## 2013-05-20 NOTE — Progress Notes (Signed)
Post Partum Day 1 Subjective: no complaints, up ad lib, voiding, tolerating PO and + flatus  Objective: Blood pressure 98/64, pulse 86, temperature 97.5 F (36.4 C), temperature source Oral, resp. rate 18, height 5' 5.5" (1.664 m), weight 141.522 kg (312 lb), last menstrual period 08/16/2012, SpO2 100.00%, unknown if currently breastfeeding.  Physical Exam:  General: alert, cooperative and no distress Lochia: appropriate Uterine Fundus: firm Incision: na  DVT Evaluation: No evidence of DVT seen on physical exam.   Recent Labs  05/18/13 2105  HGB 11.1*  HCT 33.5*    Assessment/Plan: Plan for discharge tomorrow, Lactation consult and Contraception plans for mirena   LOS: 2 days   Tiernan Suto L 05/20/2013, 7:36 AM

## 2013-05-20 NOTE — MAU Provider Note (Signed)
Attestation of Attending Supervision of Advanced Practitioner (CNM/NP): Evaluation and management procedures were performed by the Advanced Practitioner under my supervision and collaboration.  I have reviewed the Advanced Practitioner's note and chart, and I agree with the management and plan.  Cammie Faulstich 05/20/2013 1:26 PM   

## 2013-05-21 MED ORDER — ACETAMINOPHEN-CODEINE 300-30 MG PO TABS: 1.0000 | ORAL_TABLET | ORAL | Status: DC | PRN

## 2013-05-21 MED ORDER — PNEUMOCOCCAL VAC POLYVALENT 25 MCG/0.5ML IJ INJ
0.5000 mL | INJECTION | Freq: Once | INTRAMUSCULAR | Status: AC
  Administered 2013-05-21: 0.5 mL via INTRAMUSCULAR
  Filled 2013-05-21: qty 0.5

## 2013-05-21 MED ORDER — IBUPROFEN 600 MG PO TABS: 600.0000 mg | ORAL_TABLET | Freq: Four times a day (QID) | ORAL | Status: DC

## 2013-05-21 NOTE — Discharge Summary (Signed)
Obstetric Discharge Summary Reason for Admission: onset of labor Prenatal Procedures: NST and ultrasound Intrapartum Procedures: vacuum Postpartum Procedures: none Complications-Operative and Postpartum: 2nd degree degree perineal laceration Hemoglobin  Date Value Range Status  05/20/2013 8.6* 12.0 - 15.0 g/dL Final     DELTA CHECK NOTED     REPEATED TO VERIFY  11/16/2012 13.3   Final     HCT  Date Value Range Status  05/20/2013 26.0* 36.0 - 46.0 % Final  11/16/2012 38   Final    Physical Exam:  Filed Vitals:   05/21/13 0625  BP: 114/76  Pulse: 94  Temp: 97.5 F (36.4 C)  Resp: 18   General: alert, cooperative and appears stated age Lochia: appropriate Uterine Fundus: firm Incision: n/a DVT Evaluation: No evidence of DVT seen on physical exam. Negative Homan's sign.  Discharge Diagnoses: Term Pregnancy-delivered  Discharge Information: Date: 05/21/2013 Activity: pelvic rest Diet: routine Medications: Tylenol #3 and Ibuprofen Condition: stable Instructions: refer to practice specific booklet Discharge to: home Follow-up Information   Follow up with [facility] In 6 weeks.      Newborn Data: Live born female  Birth Weight: 8 lb 14.9 oz (4051 g) APGAR: 9, 9  Home with mother.  [facility] 05/21/2013, 6:54 AM

## 2013-05-23 ENCOUNTER — Inpatient Hospital Stay (HOSPITAL_COMMUNITY): Admission: RE | Admit: 2013-05-23 | Payer: Medicaid Other | Source: Ambulatory Visit

## 2013-05-24 NOTE — Progress Notes (Signed)
Post discharge chart review completed.  

## 2013-05-31 ENCOUNTER — Telehealth: Payer: Self-pay | Admitting: Advanced Practice Midwife

## 2013-05-31 MED ORDER — HYDROCODONE-ACETAMINOPHEN 5-325 MG PO TABS: 1.0000 | ORAL_TABLET | Freq: Three times a day (TID) | ORAL | Status: DC | PRN

## 2013-05-31 NOTE — Telephone Encounter (Signed)
Norco 5/325 # 10 sent to pharmacy for Perineal pain

## 2013-05-31 NOTE — Telephone Encounter (Signed)
Pt aware of Rx being called in 

## 2013-06-28 ENCOUNTER — Telehealth: Payer: Self-pay | Admitting: Obstetrics & Gynecology

## 2013-06-28 ENCOUNTER — Ambulatory Visit: Payer: Medicaid Other | Admitting: Advanced Practice Midwife

## 2013-06-28 NOTE — Telephone Encounter (Signed)
Left message x 1. JSY 

## 2013-06-30 NOTE — Telephone Encounter (Signed)
Left message x 2. JSY 

## 2013-07-05 ENCOUNTER — Encounter: Payer: Self-pay | Admitting: *Deleted

## 2013-07-05 ENCOUNTER — Encounter: Payer: Medicaid Other | Admitting: Women's Health

## 2013-07-07 NOTE — Telephone Encounter (Signed)
3 messages left with no return call.Closing encounter

## 2013-07-20 ENCOUNTER — Encounter: Payer: Self-pay | Admitting: *Deleted

## 2013-07-20 NOTE — Progress Notes (Unsigned)
Patient ID: Leslie Mayer, female   DOB: [birth year], 22 y.o.   MRN: [id number]   Tried to contact pt again to rs postpartum appt.  Left message on machine for pt to call us back.  07/20/13  AS

## 2014-02-13 ENCOUNTER — Encounter (HOSPITAL_COMMUNITY): Payer: Self-pay | Admitting: Emergency Medicine

## 2014-02-13 ENCOUNTER — Emergency Department (HOSPITAL_COMMUNITY): Payer: No Typology Code available for payment source

## 2014-02-13 ENCOUNTER — Emergency Department (HOSPITAL_COMMUNITY)
Admission: EM | Admit: 2014-02-13 | Discharge: 2014-02-13 | Disposition: A | Discharge status: Home / Self Care | Payer: No Typology Code available for payment source | Attending: Emergency Medicine | Admitting: Emergency Medicine

## 2014-02-13 DIAGNOSIS — S298XXA Other specified injuries of thorax, initial encounter: Secondary | ICD-10-CM | POA: Insufficient documentation

## 2014-02-13 DIAGNOSIS — S4980XA Other specified injuries of shoulder and upper arm, unspecified arm, initial encounter: Secondary | ICD-10-CM | POA: Insufficient documentation

## 2014-02-13 DIAGNOSIS — Y92410 Unspecified street and highway as the place of occurrence of the external cause: Secondary | ICD-10-CM | POA: Insufficient documentation

## 2014-02-13 DIAGNOSIS — Z87891 Personal history of nicotine dependence: Secondary | ICD-10-CM | POA: Insufficient documentation

## 2014-02-13 DIAGNOSIS — R0789 Other chest pain: Secondary | ICD-10-CM

## 2014-02-13 DIAGNOSIS — IMO0002 Reserved for concepts with insufficient information to code with codable children: Secondary | ICD-10-CM | POA: Insufficient documentation

## 2014-02-13 DIAGNOSIS — Y9389 Activity, other specified: Secondary | ICD-10-CM | POA: Insufficient documentation

## 2014-02-13 DIAGNOSIS — M549 Dorsalgia, unspecified: Secondary | ICD-10-CM

## 2014-02-13 DIAGNOSIS — Z79899 Other long term (current) drug therapy: Secondary | ICD-10-CM | POA: Insufficient documentation

## 2014-02-13 DIAGNOSIS — Z791 Long term (current) use of non-steroidal anti-inflammatories (NSAID): Secondary | ICD-10-CM | POA: Insufficient documentation

## 2014-02-13 DIAGNOSIS — R011 Cardiac murmur, unspecified: Secondary | ICD-10-CM | POA: Insufficient documentation

## 2014-02-13 DIAGNOSIS — S199XXA Unspecified injury of neck, initial encounter: Principal | ICD-10-CM

## 2014-02-13 DIAGNOSIS — E669 Obesity, unspecified: Secondary | ICD-10-CM | POA: Insufficient documentation

## 2014-02-13 DIAGNOSIS — M542 Cervicalgia: Secondary | ICD-10-CM

## 2014-02-13 DIAGNOSIS — S46909A Unspecified injury of unspecified muscle, fascia and tendon at shoulder and upper arm level, unspecified arm, initial encounter: Secondary | ICD-10-CM | POA: Insufficient documentation

## 2014-02-13 DIAGNOSIS — S0993XA Unspecified injury of face, initial encounter: Secondary | ICD-10-CM | POA: Insufficient documentation

## 2014-02-13 DIAGNOSIS — J45909 Unspecified asthma, uncomplicated: Secondary | ICD-10-CM | POA: Insufficient documentation

## 2014-02-13 MED ORDER — OXYCODONE-ACETAMINOPHEN 5-325 MG PO TABS
1.0000 | ORAL_TABLET | Freq: Once | ORAL | Status: AC
  Administered 2014-02-13: 1 via ORAL
  Filled 2014-02-13: qty 1

## 2014-02-13 MED ORDER — HYDROCODONE-ACETAMINOPHEN 5-325 MG PO TABS
2.0000 | ORAL_TABLET | Freq: Once | ORAL | Status: AC
  Administered 2014-02-13: 2 via ORAL
  Filled 2014-02-13: qty 2

## 2014-02-13 MED ORDER — NAPROXEN 500 MG PO TABS: 500.0000 mg | ORAL_TABLET | Freq: Two times a day (BID) | ORAL | Status: DC

## 2014-02-13 MED ORDER — CYCLOBENZAPRINE HCL 5 MG PO TABS: 5.0000 mg | ORAL_TABLET | Freq: Three times a day (TID) | ORAL | Status: DC | PRN

## 2014-02-13 MED ORDER — OXYCODONE-ACETAMINOPHEN 5-325 MG PO TABS: 1.0000 | ORAL_TABLET | ORAL | Status: DC | PRN
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
## 2014-02-13 NOTE — Discharge Instructions (Signed)
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 Lolly MustacheArfeen  [phone]) [phone]   [facility]  [employer] [facility] 1) [address], [place] 2) [address], [place] 3)  [address], [place] [phone]) [phone]) [phone]  [phone]) [phone]   [facility] Child Abuse Hotline [phone]) [phone] or [phone]) [phone] (After Hours)
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
## 2014-02-15 NOTE — ED Provider Notes (Signed)
Medical screening examination/treatment/procedure(s) were performed by non-physician practitioner and as supervising physician I was immediately available for consultation/collaboration.  Shyvonne Chastang J. Syann Cupples, MD 02/15/14 1650 

## 2014-07-23 ENCOUNTER — Encounter (HOSPITAL_COMMUNITY): Payer: Self-pay | Admitting: Emergency Medicine

## 2014-09-21 NOTE — L&D Delivery Note (Signed)
Delivery Note At 1:09 AM a viable female, "Leslie Mayer", was delivered via Vaginal, Spontaneous Delivery (Presentation: Right Occiput Anterior).  APGAR: 8, 9; weight  .   Placenta status: Intact, Spontaneous.  Cord: 3 vessels with the following complications: None.  Cord pH: NA Mild shoulder dystocia noted, resolved with McRoberts and suprapubic pressure applied simultaneously.  Anesthesia: Epidural  Episiotomy: None Lacerations: None Suture Repair: None Est. Blood Loss (mL): 175  Mom to postpartum.  Baby to Couplet care / Skin to Skin. 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  Nigel Bridgeman 04/17/2015, 1:43 AM

## 2014-10-02 ENCOUNTER — Inpatient Hospital Stay (HOSPITAL_COMMUNITY)
Admission: AD | Admit: 2014-10-02 | Discharge: 2014-10-03 | Disposition: A | Discharge status: Home / Self Care | Payer: Medicaid Other | Source: Ambulatory Visit | Attending: Obstetrics & Gynecology | Admitting: Obstetrics & Gynecology

## 2014-10-02 DIAGNOSIS — O9989 Other specified diseases and conditions complicating pregnancy, childbirth and the puerperium: Secondary | ICD-10-CM | POA: Insufficient documentation

## 2014-10-02 DIAGNOSIS — Z87891 Personal history of nicotine dependence: Secondary | ICD-10-CM | POA: Insufficient documentation

## 2014-10-02 DIAGNOSIS — Z3A09 9 weeks gestation of pregnancy: Secondary | ICD-10-CM | POA: Insufficient documentation

## 2014-10-02 DIAGNOSIS — J0110 Acute frontal sinusitis, unspecified: Secondary | ICD-10-CM

## 2014-10-02 DIAGNOSIS — J019 Acute sinusitis, unspecified: Secondary | ICD-10-CM | POA: Insufficient documentation

## 2014-10-02 DIAGNOSIS — N898 Other specified noninflammatory disorders of vagina: Secondary | ICD-10-CM | POA: Insufficient documentation

## 2014-10-02 LAB — POCT PREGNANCY, URINE: Preg Test, Ur: POSITIVE — AB

## 2014-10-03 ENCOUNTER — Encounter (HOSPITAL_COMMUNITY): Payer: Self-pay | Admitting: *Deleted

## 2014-10-03 LAB — URINE MICROSCOPIC-ADD ON

## 2014-10-03 LAB — URINALYSIS, ROUTINE W REFLEX MICROSCOPIC
Bilirubin Urine: NEGATIVE
Glucose, UA: NEGATIVE mg/dL
Hgb urine dipstick: NEGATIVE
Ketones, ur: 15 mg/dL — AB
Nitrite: NEGATIVE
PROTEIN: NEGATIVE mg/dL
Specific Gravity, Urine: >1.03 — ABNORMAL HIGH (ref 1.005–1.030)
UROBILINOGEN UA: 0.2 mg/dL (ref 0.0–1.0)
pH: 6 (ref 5.0–8.0)

## 2014-10-03 LAB — RAPID STREP SCREEN (MED CTR MEBANE ONLY): Streptococcus, Group A Screen (Direct): NEGATIVE

## 2014-10-03 LAB — WET PREP, GENITAL
Trich, Wet Prep: NONE SEEN
YEAST WET PREP: NONE SEEN

## 2014-10-03 NOTE — MAU Provider Note (Signed)
History     CSN: [id number]  Arrival date and time: 10/02/14 2344   First Provider Initiated Contact with Patient 10/03/14 0015      No chief complaint on file.  HPI Ms. Leslie Mayer is a 24 y.o. G2P1001 at 5735w5d who presents to MAU today with complaint of cough and nasal congestion since Saturday. She denies fever, ear pain, abdominal pain or vaginal bleeding. She also has complaint of a yellow vaginal discharge and is concerned about a yeast infection. She states +HPT in November, but "was in denial" about pregnancy and has not started care. Plans to go to Dr. Gaynell FaceM[facility].    OB History    Gravida Para Term Preterm AB TAB SAB Ectopic Multiple Living   2 1 1  0 0 0 0 0 0 1      Past Medical History  Diagnosis Date  . Asthma   . Recurrent sinus infections   . Heart murmur   . Obesity 11/23/2012  . Nausea and vomiting in pregnancy 11/23/2012  . Pregnant     Past Surgical History  Procedure Laterality Date  . No past surgeries      Family History  Problem Relation Age of Onset  . Hypertension Other   . Cancer Other     mat great gma throat cancer  . Heart disease Paternal Grandmother   . Hypertension Paternal Grandmother     History  Substance Use Topics  . Smoking status: Former Smoker -- 1.00 packs/day    Types: Cigarettes  . Smokeless tobacco: Never Used  . Alcohol Use: No    Allergies:  Allergies  Allergen Reactions  . Keflex [Cephalexin]     Hands peeled after taking this antibiotic    Prescriptions prior to admission  Medication Sig Dispense Refill Last Dose  . albuterol (PROVENTIL HFA;VENTOLIN HFA) 108 (90 BASE) MCG/ACT inhaler Inhale 2 puffs into the lungs every 6 (six) hours as needed.   Past Month at Unknown time  . acetaminophen (TYLENOL) 500 MG tablet Take 1,000 mg by mouth every 6 (six) hours as needed.   More than a month at Unknown time  . cyclobenzaprine (FLEXERIL) 5 MG tablet Take 1 tablet (5 mg total) by mouth 3 (three) times daily as needed  for muscle spasms. 15 tablet 0 More than a month at Unknown time  . hydrocortisone 2.5 % cream Apply 1 application topically daily. Apply to arms daily   More than a month at Unknown time  . ibuprofen (ADVIL,MOTRIN) 200 MG tablet Take 200-400 mg by mouth every 6 (six) hours as needed.   More than a month at Unknown time  . loratadine (CLARITIN) 10 MG tablet Take 10 mg by mouth daily as needed for allergies.   More than a month at Unknown time  . naproxen (NAPROSYN) 500 MG tablet Take 1 tablet (500 mg total) by mouth 2 (two) times daily with a meal. 30 tablet 0 More than a month at Unknown time  . oxyCODONE-acetaminophen (PERCOCET/ROXICET) 5-325 MG per tablet Take 1 tablet by mouth every 4 (four) hours as needed for severe pain. 6 tablet 0 More than a month at Unknown time    Review of Systems  Constitutional: Negative for fever and malaise/fatigue.  HENT: Positive for congestion and sore throat. Negative for ear discharge and ear pain.   Respiratory: Positive for cough.   Gastrointestinal: Negative for abdominal pain.  Genitourinary:       + vaginal discharge Neg - vaginal bleeding  Neurological:  Positive for headaches.   Physical Exam   Last menstrual period 07/27/2014, unknown if currently breastfeeding.  Physical Exam  Constitutional: She is oriented to person, place, and time. She appears well-developed and well-nourished. No distress.  HENT:  Head: Normocephalic.  Right Ear: Tympanic membrane and external ear normal. No drainage. A foreign body (large amount of cerumen noted) is present.  Left Ear: Tympanic membrane and external ear normal. A foreign body (large amount of cerumen noted) is present.  Nose: Mucosal edema and rhinorrhea present. Right sinus exhibits frontal sinus tenderness. Right sinus exhibits no maxillary sinus tenderness. Left sinus exhibits frontal sinus tenderness. Left sinus exhibits no maxillary sinus tenderness.  Mouth/Throat: Oropharyngeal exudate and  posterior oropharyngeal erythema present. No posterior oropharyngeal edema or tonsillar abscesses.  Cardiovascular: Normal rate.   Respiratory: Effort normal.  GI: Soft. She exhibits no distension and no mass. There is no tenderness. There is no rebound and no guarding.  Genitourinary: Vaginal discharge (small amount of thin, white discharge noted) found.  Lymphadenopathy:       Head (right side): No submental, no submandibular and no tonsillar adenopathy present.       Head (left side): No submental, no submandibular and no tonsillar adenopathy present.    She has no cervical adenopathy.  Neurological: She is alert and oriented to person, place, and time.  Skin: Skin is warm and dry. No erythema.  Psychiatric: She has a normal mood and affect.   Results for orders placed or performed during the hospital encounter of 10/02/14 (from the past 24 hour(s))  Urinalysis, Routine w reflex microscopic     Status: Abnormal   Collection Time: 10/02/14 11:52 PM  Result Value Ref Range   Color, Urine YELLOW YELLOW   APPearance CLEAR CLEAR   Specific Gravity, Urine >1.030 (H) 1.005 - 1.030   pH 6.0 5.0 - 8.0   Glucose, UA NEGATIVE NEGATIVE mg/dL   Hgb urine dipstick NEGATIVE NEGATIVE   Bilirubin Urine NEGATIVE NEGATIVE   Ketones, ur 15 (A) NEGATIVE mg/dL   Protein, ur NEGATIVE NEGATIVE mg/dL   Urobilinogen, UA 0.2 0.0 - 1.0 mg/dL   Nitrite NEGATIVE NEGATIVE   Leukocytes, UA SMALL (A) NEGATIVE  Urine microscopic-add on     Status: Abnormal   Collection Time: 10/02/14 11:52 PM  Result Value Ref Range   Squamous Epithelial / LPF FEW (A) RARE   WBC, UA 3-6 <3 WBC/hpf   RBC / HPF 0-2 <3 RBC/hpf   Bacteria, UA FEW (A) RARE  Pregnancy, urine POC     Status: Abnormal   Collection Time: 10/03/14 12:00 AM  Result Value Ref Range   Preg Test, Ur POSITIVE (A) NEGATIVE  Wet prep, genital     Status: Abnormal   Collection Time: 10/03/14 12:27 AM  Result Value Ref Range   Yeast Wet Prep HPF POC NONE  SEEN NONE SEEN   Trich, Wet Prep NONE SEEN NONE SEEN   Clue Cells Wet Prep HPF POC FEW (A) NONE SEEN   WBC, Wet Prep HPF POC MODERATE (A) NONE SEEN  Rapid strep screen     Status: None   Collection Time: 10/03/14 12:27 AM  Result Value Ref Range   Streptococcus, Group A Screen (Direct) NEGATIVE NEGATIVE     MAU Course  Procedures None  MDM Rapid Strep and wet prep today  Assessment and Plan  A: SIUP at [redacted]w[redacted]d by LMP Acute Sinusitis  P: Discharge home Rx for Bactrim given to patient Patient given list  of OTC medications for symptomatic relief Patient encouraged to start prenatal care ASAP with Dr. Gaynell Face Patient may return to MAU as needed or if her condition were to change or worsen   Marny Lowenstein, PA-C  10/03/2014, 12:15 AM

## 2014-10-03 NOTE — MAU Note (Addendum)
PT SAYS HER NOSE   AND CHEST  IS CONGESTED-  STARTED ON  Sunday . HAS SORE THROAT-  STARTED  YESTERDAY .  NO FEVER  AT HOME-  BUT  HAD  CHILLS.   HAS  RUNNY NOSE.   HAS COUGH-   WITH  STREAKS  OF  BLOOD  IN MUCUS.     HAS  TAKEN  SUDAFED-  NO RELIEF.      HAD VAG  DEL ON 05-19-2013-     NO   BIRTH  CONTROL-  LAST SEX-  SAT.      VOMITING  YESTERDAY  -  NAUSEA.     NO DIARRHEA.   [facility].     HPT  IN NOV-    POSITIVE

## 2014-10-04 ENCOUNTER — Encounter (HOSPITAL_COMMUNITY): Payer: Self-pay

## 2014-10-04 ENCOUNTER — Emergency Department (HOSPITAL_COMMUNITY)
Admission: EM | Admit: 2014-10-04 | Discharge: 2014-10-04 | Disposition: A | Discharge status: Home / Self Care | Payer: No Typology Code available for payment source | Attending: Emergency Medicine | Admitting: Emergency Medicine

## 2014-10-04 DIAGNOSIS — Z7952 Long term (current) use of systemic steroids: Secondary | ICD-10-CM | POA: Insufficient documentation

## 2014-10-04 DIAGNOSIS — R011 Cardiac murmur, unspecified: Secondary | ICD-10-CM | POA: Insufficient documentation

## 2014-10-04 DIAGNOSIS — O2331 Infections of other parts of urinary tract in pregnancy, first trimester: Secondary | ICD-10-CM | POA: Insufficient documentation

## 2014-10-04 DIAGNOSIS — J45909 Unspecified asthma, uncomplicated: Secondary | ICD-10-CM | POA: Insufficient documentation

## 2014-10-04 DIAGNOSIS — Z87891 Personal history of nicotine dependence: Secondary | ICD-10-CM | POA: Insufficient documentation

## 2014-10-04 DIAGNOSIS — Z3A09 9 weeks gestation of pregnancy: Secondary | ICD-10-CM | POA: Insufficient documentation

## 2014-10-04 DIAGNOSIS — Z79899 Other long term (current) drug therapy: Secondary | ICD-10-CM | POA: Insufficient documentation

## 2014-10-04 DIAGNOSIS — O2341 Unspecified infection of urinary tract in pregnancy, first trimester: Secondary | ICD-10-CM

## 2014-10-04 DIAGNOSIS — J0110 Acute frontal sinusitis, unspecified: Secondary | ICD-10-CM | POA: Insufficient documentation

## 2014-10-04 DIAGNOSIS — O99511 Diseases of the respiratory system complicating pregnancy, first trimester: Secondary | ICD-10-CM | POA: Insufficient documentation

## 2014-10-04 DIAGNOSIS — E669 Obesity, unspecified: Secondary | ICD-10-CM | POA: Insufficient documentation

## 2014-10-04 DIAGNOSIS — Z791 Long term (current) use of non-steroidal anti-inflammatories (NSAID): Secondary | ICD-10-CM | POA: Insufficient documentation

## 2014-10-04 LAB — BASIC METABOLIC PANEL
ANION GAP: 11 (ref 5–15)
BUN: 6 mg/dL (ref 6–23)
CALCIUM: 9.1 mg/dL (ref 8.4–10.5)
CHLORIDE: 104 meq/L (ref 96–112)
CO2: 19 mmol/L (ref 19–32)
Creatinine, Ser: 0.75 mg/dL (ref 0.50–1.10)
GFR calc Af Amer: >90 mL/min (ref >90)
GFR calc non Af Amer: >90 mL/min (ref >90)
GLUCOSE: 77 mg/dL (ref 70–99)
Potassium: 5.3 mmol/L — ABNORMAL HIGH (ref 3.5–5.1)
SODIUM: 134 mmol/L — AB (ref 135–145)

## 2014-10-04 LAB — CBC WITH DIFFERENTIAL/PLATELET
BASOS ABS: 0 10*3/uL (ref 0.0–0.1)
BASOS PCT: 0 % (ref 0–1)
EOS ABS: 0.2 10*3/uL (ref 0.0–0.7)
Eosinophils Relative: 2 % (ref 0–5)
HEMATOCRIT: 41 % (ref 36.0–46.0)
HEMOGLOBIN: 14 g/dL (ref 12.0–15.0)
Lymphocytes Relative: 22 % (ref 12–46)
Lymphs Abs: 2.4 10*3/uL (ref 0.7–4.0)
MCH: 28.1 pg (ref 26.0–34.0)
MCHC: 34.1 g/dL (ref 30.0–36.0)
MCV: 82.3 fL (ref 78.0–100.0)
MONO ABS: 0.8 10*3/uL (ref 0.1–1.0)
MONOS PCT: 8 % (ref 3–12)
NEUTROS ABS: 7.3 10*3/uL (ref 1.7–7.7)
Neutrophils Relative %: 68 % (ref 43–77)
PLATELETS: 272 10*3/uL (ref 150–400)
RBC: 4.98 MIL/uL (ref 3.87–5.11)
RDW: 16.1 % — ABNORMAL HIGH (ref 11.5–15.5)
WBC: 10.7 10*3/uL — AB (ref 4.0–10.5)

## 2014-10-04 LAB — URINALYSIS, ROUTINE W REFLEX MICROSCOPIC
Bilirubin Urine: NEGATIVE
Glucose, UA: NEGATIVE mg/dL
HGB URINE DIPSTICK: NEGATIVE
KETONES UR: NEGATIVE mg/dL
NITRITE: NEGATIVE
Protein, ur: NEGATIVE mg/dL
Specific Gravity, Urine: 1.026 (ref 1.005–1.030)
Urobilinogen, UA: 0.2 mg/dL (ref 0.0–1.0)
pH: 6.5 (ref 5.0–8.0)

## 2014-10-04 LAB — RAPID STREP SCREEN (MED CTR MEBANE ONLY): Streptococcus, Group A Screen (Direct): NEGATIVE

## 2014-10-04 LAB — CULTURE, GROUP A STREP

## 2014-10-04 LAB — URINE MICROSCOPIC-ADD ON

## 2014-10-04 LAB — I-STAT CG4 LACTIC ACID, ED: LACTIC ACID, VENOUS: 1.5 mmol/L (ref 0.5–2.2)

## 2014-10-04 MED ORDER — SODIUM CHLORIDE 0.9 % IV BOLUS (SEPSIS)
1000.0000 mL | Freq: Once | INTRAVENOUS | Status: AC
  Administered 2014-10-04: 1000 mL via INTRAVENOUS

## 2014-10-04 MED ORDER — AMOXICILLIN 500 MG PO CAPS: 500.0000 mg | ORAL_CAPSULE | Freq: Three times a day (TID) | ORAL | Status: DC

## 2014-10-04 MED ORDER — PRENATAL COMPLETE 14-0.4 MG PO TABS
1.0000 | ORAL_TABLET | Freq: Every day | ORAL | Status: DC
Start: 2014-10-04 — End: 2019-11-28

## 2014-10-04 NOTE — ED Notes (Signed)
Pt from home with generalized body aches and nasal congestion that has been present since Tuesday.  [facility] and given a prescription for bactrim with no relief.  Pt sts she feels worse today.  Pt is [redacted] weeks pregnant.

## 2014-10-04 NOTE — ED Provider Notes (Signed)
CSN: [id number]     Arrival date & time 10/04/14  1430 History   First MD Initiated Contact with Patient 10/04/14 1724     Chief Complaint  Patient presents with  . Generalized Body Aches  . Nasal Congestion     (Consider location/radiation/quality/duration/timing/severity/associated sxs/prior Treatment) HPI   Leslie Mayer is a 24 y.o. female with past medical history significant for asthma, obesity, [redacted] weeks pregnant complaining of nasal congestion, sinus pressure, decreased by mouth intake, cough onset 2 days ago. [facility], she was started on Bactrim for sinusitis but states it is making her feel worse. Patient denies fever, chills, chest pain, shortness of breath, nausea, vomiting, abdominal pain, abnormal vaginal discharge, vaginal bleeding, dysuria, hematuria, urinary frequency. She is not taking any prenatal vitamins.   Past Medical History  Diagnosis Date  . Asthma   . Recurrent sinus infections   . Heart murmur   . Obesity 11/23/2012  . Nausea and vomiting in pregnancy 11/23/2012  . Pregnant    Past Surgical History  Procedure Laterality Date  . No past surgeries     Family History  Problem Relation Age of Onset  . Hypertension Other   . Cancer Other     mat great gma throat cancer  . Heart disease Paternal Grandmother   . Hypertension Paternal Grandmother    History  Substance Use Topics  . Smoking status: Former Smoker -- 1.00 packs/day    Types: Cigarettes  . Smokeless tobacco: Never Used  . Alcohol Use: No   OB History    Gravida Para Term Preterm AB TAB SAB Ectopic Multiple Living   2 1 1  0 0 0 0 0 0 1     Review of Systems  10 systems reviewed and found to be negative, except as noted in the HPI.   Allergies  Keflex  Home Medications   Prior to Admission medications   Medication Sig Start Date End Date Taking? Authorizing Provider  acetaminophen (TYLENOL) 500 MG tablet Take 1,000 mg by mouth  every 6 (six) hours as needed.    Historical Provider, MD  albuterol (PROVENTIL HFA;VENTOLIN HFA) 108 (90 BASE) MCG/ACT inhaler Inhale 2 puffs into the lungs every 6 (six) hours as needed.    Historical Provider, MD  cyclobenzaprine (FLEXERIL) 5 MG tablet Take 1 tablet (5 mg total) by mouth 3 (three) times daily as needed for muscle spasms. 02/13/14   Jillyn LedgerJessica K Palmer, PA-C  hydrocortisone 2.5 % cream Apply 1 application topically daily. Apply to arms daily    Historical Provider, MD  ibuprofen (ADVIL,MOTRIN) 200 MG tablet Take 200-400 mg by mouth every 6 (six) hours as needed.    Historical Provider, MD  loratadine (CLARITIN) 10 MG tablet Take 10 mg by mouth daily as needed for allergies.    Historical Provider, MD  naproxen (NAPROSYN) 500 MG tablet Take 1 tablet (500 mg total) by mouth 2 (two) times daily with a meal. 02/13/14   Jillyn LedgerJessica K Palmer, PA-C  oxyCODONE-acetaminophen (PERCOCET/ROXICET) 5-325 MG per tablet Take 1 tablet by mouth every 4 (four) hours as needed for severe pain. 02/13/14   Janene HarveyJessica K Palmer, PA-C   BP 144/80 mmHg  Pulse 112  Temp(Src) 98.6 F (37 C) (Oral)  Resp 22  Ht 5\' 4"  (1.626 m)  Wt 285 lb (129.275 kg)  BMI 48.90 kg/m2  SpO2 98%  LMP 07/27/2014 Physical Exam  Constitutional: She is oriented to person, place, and time. She  appears well-developed and well-nourished. No distress.  HENT:  Head: Normocephalic.  Mouth/Throat: Oropharynx is clear and moist.  No drooling or stridor. Posterior pharynx mildly erythematous no significant tonsillar hypertrophy. No exudate. Soft palate rises symmetrically. No TTP or induration under tongue.   ++ Tenderness topalpation of frontal R>L    ++ Rhinorrhea and mucosal edema.  Bilateral tympanic membranes with normal architecture and good light reflex.    Eyes: Conjunctivae and EOM are normal. Pupils are equal, round, and reactive to light.  Neck: Normal range of motion. Neck supple.  Cardiovascular: Normal rate, regular  rhythm and intact distal pulses.   Pulmonary/Chest: Effort normal and breath sounds normal. No stridor. No respiratory distress. She has no wheezes. She has no rales. She exhibits no tenderness.  Abdominal: Soft. Bowel sounds are normal. She exhibits no distension and no mass. There is no tenderness. There is no rebound and no guarding.  Musculoskeletal: Normal range of motion.  Neurological: She is alert and oriented to person, place, and time.  Psychiatric: She has a normal mood and affect.  Nursing note and vitals reviewed.   ED Course  Procedures (including critical care time) Labs Review Labs Reviewed  URINE CULTURE  RAPID STREP SCREEN  CBC WITH DIFFERENTIAL  BASIC METABOLIC PANEL  URINALYSIS, ROUTINE W REFLEX MICROSCOPIC  I-STAT CG4 LACTIC ACID, ED    Imaging Review No results found.   EKG Interpretation None      MDM   Final diagnoses:  Acute frontal sinusitis, recurrence not specified  UTI in pregnancy, first trimester    Filed Vitals:   10/04/14 1945 10/04/14 2000 10/04/14 2030 10/04/14 2147  BP: 134/81 128/99 141/96 141/96  Pulse: 103 98 102 101  Temp:      TempSrc:      Resp:    16  Height:      Weight:      SpO2: 100% 100% 100% 100%    Medications  sodium chloride 0.9 % bolus 1,000 mL (0 mLs Intravenous Stopped 10/04/14 2151)  sodium chloride 0.9 % bolus 1,000 mL (0 mLs Intravenous Stopped 10/04/14 2151)    Leslie Mayer is a pleasant 24 y.o. female who is [redacted] weeks pregnant with generalized body aches, nasal congestion. On my exam are frontal sinuses are tender to palpation. Patient has a Keflex allergy. She was started on Bactrim for acute sinusitis 2 days ago at [facility]. Patient has been taking the medication but states it is not helping. Patient is afebrile, I doubt this is the flu. Mild leukocytosis of 10.7, lactic acid is normal. Lung sounds are clear to auscultation and patient saturating well on room air, I doubt this is a pneumonia.  Patient is requesting amoxicillin. She tells me that she is penicillin allergic. I have told her that this is contradictory but she is quite clear that she's had amoxicillin in the past. I've advised her that her UA is consistent with infection and this is potentially dangerous to the fetus it needs to be treated with Bactrim I have advised her to continue the Bactrim. Upon the writing of this note after discharge I see a new allergy to sulfa antibiotics with detail of rash. Patient had no rash on my exam. I was quite quite clear with her that she needs to continue the Bactrim for treatment of UTI. Patient had verbalized her understanding of this. [facility].  Evaluation does not show pathology that would require ongoing emergent intervention or inpatient treatment.  Pt is hemodynamically stable and mentating appropriately. Discussed findings and plan with patient/guardian, who agrees with care plan. All questions answered. Return precautions discussed and outpatient follow up given.   Discharge Medication List as of 10/04/2014  9:46 PM    START taking these medications   Details  amoxicillin (AMOXIL) 500 MG capsule Take 1 capsule (500 mg total) by mouth 3 (three) times daily., Starting 10/04/2014, Until Discontinued, Print    Prenatal Vit-Fe Fumarate-FA (PRENATAL COMPLETE) 14-0.4 MG TABS Take 1 tablet by mouth daily., Starting 10/04/2014, Until Discontinued, [employer], PA-C 10/05/14 0865  Mirian Mo, MD 10/05/14 [phone]

## 2014-10-04 NOTE — Discharge Instructions (Signed)
Continue take the Bactrim for your urinary tract infection.  Push fluids, you need to drink plenty of water.  You can take the amoxicillin for the sinusitis.   Please follow with your primary care doctor in the next 2 days for a check-up. They must obtain records for further management.   Do not hesitate to return to the Emergency Department for any new, worsening or concerning symptoms.   Follow with OB/GYN as soon as possible.   Do NOT take any NSAIDs, such as Aspirin, Motrin, Ibuprofen, Aleve, Naproxen etc. Only take Tylenol for pain. Return to the emergency room  for any severe abdominal pain, increasing vaginal bleeding, passing out or repeated vomiting.

## 2014-10-06 LAB — URINE CULTURE: Colony Count: 25000

## 2014-10-06 LAB — CULTURE, GROUP A STREP

## 2014-12-05 LAB — OB RESULTS CONSOLE HIV ANTIBODY (ROUTINE TESTING): HIV: NONREACTIVE

## 2014-12-05 LAB — OB RESULTS CONSOLE RPR: RPR: NONREACTIVE

## 2014-12-05 LAB — OB RESULTS CONSOLE HEPATITIS B SURFACE ANTIGEN: Hepatitis B Surface Ag: NEGATIVE

## 2014-12-05 LAB — OB RESULTS CONSOLE GBS: GBS: POSITIVE

## 2014-12-05 LAB — OB RESULTS CONSOLE GC/CHLAMYDIA
CHLAMYDIA, DNA PROBE: NEGATIVE
GC PROBE AMP, GENITAL: NEGATIVE

## 2014-12-05 LAB — OB RESULTS CONSOLE ANTIBODY SCREEN: Antibody Screen: NEGATIVE

## 2014-12-05 LAB — OB RESULTS CONSOLE RUBELLA ANTIBODY, IGM: Rubella: IMMUNE

## 2014-12-05 LAB — OB RESULTS CONSOLE ABO/RH: RH Type: POSITIVE

## 2015-02-26 ENCOUNTER — Inpatient Hospital Stay (HOSPITAL_COMMUNITY)
Admission: AD | Admit: 2015-02-26 | Discharge: 2015-02-26 | Disposition: A | Discharge status: Home / Self Care | Payer: Medicaid Other | Source: Ambulatory Visit | Attending: Obstetrics and Gynecology | Admitting: Obstetrics and Gynecology

## 2015-02-26 ENCOUNTER — Encounter (HOSPITAL_COMMUNITY): Payer: Self-pay | Admitting: *Deleted

## 2015-02-26 DIAGNOSIS — O98313 Other infections with a predominantly sexual mode of transmission complicating pregnancy, third trimester: Secondary | ICD-10-CM | POA: Diagnosis not present

## 2015-02-26 DIAGNOSIS — O99820 Streptococcus B carrier state complicating pregnancy: Secondary | ICD-10-CM | POA: Insufficient documentation

## 2015-02-26 DIAGNOSIS — Z87891 Personal history of nicotine dependence: Secondary | ICD-10-CM | POA: Diagnosis not present

## 2015-02-26 DIAGNOSIS — R102 Pelvic and perineal pain: Secondary | ICD-10-CM | POA: Diagnosis present

## 2015-02-26 DIAGNOSIS — A630 Anogenital (venereal) warts: Secondary | ICD-10-CM | POA: Diagnosis not present

## 2015-02-26 DIAGNOSIS — Z3A33 33 weeks gestation of pregnancy: Secondary | ICD-10-CM | POA: Insufficient documentation

## 2015-02-26 MED ORDER — IMIQUIMOD 5 % EX CREA: TOPICAL_CREAM | CUTANEOUS | Status: DC

## 2015-02-26 NOTE — Discharge Instructions (Signed)
Genital Warts Genital warts are a sexually transmitted infection. They may appear as small bumps on the tissues of the genital area. CAUSES  Genital warts are caused by a virus called human papillomavirus (HPV). HPV is the most common sexually transmitted disease (STD) and infection of the sex organs. This infection is spread by having unprotected sex with an infected person. It can be spread by vaginal, anal, and oral sex. Many people do not know they are infected. They may be infected for years without problems. However, even if they do not have problems, they can unknowingly pass the infection to their sexual partners. SYMPTOMS   Itching and irritation in the genital area.  Warts that bleed.  Painful sexual intercourse. DIAGNOSIS  Warts are usually recognized with the naked eye on the vagina, vulva, perineum, anus, and rectum. Certain tests can also diagnose genital warts, such as:  A Pap test.  A tissue sample (biopsy) exam.  Colposcopy. A magnifying tool is used to examine the vagina and cervix. The HPV cells will change color when certain solutions are used. TREATMENT  Warts can be removed by:  Applying certain chemicals, such as cantharidin or podophyllin.  Liquid nitrogen freezing (cryotherapy).  Immunotherapy with Candida or Trichophyton injections.  Laser treatment.  Burning with an electrified probe (electrocautery).  Interferon injections.  Surgery. PREVENTION  HPV vaccination can help prevent HPV infections that cause genital warts and that cause cancer of the cervix. It is recommended that the vaccination be given to people between the ages 9 to 26 years old. The vaccine might not work as well or might not work at all if you already have HPV. It should not be given to pregnant women. HOME CARE INSTRUCTIONS   It is important to follow your caregiver's instructions. The warts will not go away without treatment. Repeat treatments are often needed to get rid of warts.  Even after it appears that the warts are gone, the normal tissue underneath often remains infected.  Do not try to treat genital warts with medicine used to treat hand warts. This type of medicine is strong and can burn the skin in the genital area, causing more damage.  Tell your past and current sexual partner(s) that you have genital warts. They may be infected also and need treatment.  Avoid sexual contact while being treated.  Do not touch or scratch the warts. The infection may spread to other parts of your body.  Women with genital warts should have a cervical cancer check (Pap test) at least once a year. This type of cancer is slow-growing and can be cured if found early. Chances of developing cervical cancer are increased with HPV.  Inform your obstetrician about your warts in the event of pregnancy. This virus can be passed to the baby's respiratory tract. Discuss this with your caregiver.  Use a condom during sexual intercourse. Following treatment, the use of condoms will help prevent reinfection.  Ask your caregiver about using over-the-counter anti-itch creams. SEEK MEDICAL CARE IF:   Your treated skin becomes red, swollen, or painful.  You have a fever.  You feel generally ill.  You feel little lumps in and around your genital area.  You are bleeding or have painful sexual intercourse. MAKE SURE YOU:   Understand these instructions.  Will watch your condition.  Will get help right away if you are not doing well or get worse. Document Released: 09/04/2000 Document Revised: 01/22/2014 Document Reviewed: 03/16/2011 ExitCare Patient Information 2015 [employer]. This   information is not intended to replace advice given to you by your health care provider. Make sure you discuss any questions you have with your health care provider.  

## 2015-02-26 NOTE — MAU Note (Signed)
Pt states she has had vaginal irritation and pain for the last 3 days.lower abd pain.

## 2015-02-26 NOTE — MAU Provider Note (Signed)
Leslie Mayer is a 24 y.o. G2P1 at 33.4 weeks per Jake Samples, presents to MAU unannounced c/o pain from genital warts.  She denies vb lof or ctx w/+FM.  She had and abn pap ASCUS, HPV +.  Last appointment in the office was on 6/1 c/o hemorrhoid.  She is GBS positive   History     Patient Active Problem List   Diagnosis Date Noted  . Asthma 11/23/2012  . Obesity 11/23/2012    No chief complaint on file.  HPI  OB History    Gravida Para Term Preterm AB TAB SAB Ectopic Multiple Living   0 0 0 0 0 0 1      Past Medical History  Diagnosis Date  . Asthma   . Recurrent sinus infections   . Heart murmur   . Obesity 11/23/2012  . Nausea and vomiting in pregnancy 11/23/2012  . Pregnant     Past Surgical History  Procedure Laterality Date  . No past surgeries      Family History  Problem Relation Age of Onset  . Hypertension Other   . Cancer Other     mat great gma throat cancer  . Heart disease Paternal Grandmother   . Hypertension Paternal Grandmother     History  Substance Use Topics  . Smoking status: Former Smoker -- 1.00 packs/day    Types: Cigarettes  . Smokeless tobacco: Never Used  . Alcohol Use: No    Allergies:  Allergies  Allergen Reactions  . Keflex [Cephalexin]     Hands peeled after taking this antibiotic  . Sulfa Antibiotics Rash    Prescriptions prior to admission  Medication Sig Dispense Refill Last Dose  . acetaminophen (TYLENOL) 500 MG tablet Take 1,000 mg by mouth every 6 (six) hours as needed.   Past Month at Unknown time  . cyclobenzaprine (FLEXERIL) 10 MG tablet Take 10 mg by mouth 3 (three) times daily as needed for muscle spasms.     Marland Kitchen albuterol (PROVENTIL HFA;VENTOLIN HFA) 108 (90 BASE) MCG/ACT inhaler Inhale 2 puffs into the lungs every 6 (six) hours as needed.   More than a month at Unknown time  . amoxicillin (AMOXIL) 500 MG capsule Take 1 capsule (500 mg total) by mouth 3 (three) times daily. 30 capsule 0   . ibuprofen  (ADVIL,MOTRIN) 200 MG tablet Take 200-400 mg by mouth every 6 (six) hours as needed.   Past Month at Unknown time  . loratadine (CLARITIN) 10 MG tablet Take 10 mg by mouth daily as needed for allergies.   More than a month at Unknown time  . naproxen (NAPROSYN) 500 MG tablet Take 1 tablet (500 mg total) by mouth 2 (two) times daily with a meal. 30 tablet 0 More than a month at Unknown time  . oxyCODONE-acetaminophen (PERCOCET/ROXICET) 5-325 MG per tablet Take 1 tablet by mouth every 4 (four) hours as needed for severe pain. 6 tablet 0 More than a month at Unknown time  . Prenatal Vit-Fe Fumarate-FA (PRENATAL COMPLETE) 14-0.4 MG TABS Take 1 tablet by mouth daily. 60 each 0 More than a month at Unknown time    ROS See HPI above, all other systems are negative  Physical Exam   Blood pressure 135/72, pulse 104, temperature 99 F (37.2 C), temperature source Oral, resp. rate 20, height  (1.626 m), weight 324 lb (146.965 kg), last menstrual period 07/27/2014, SpO2 100 %, unknown if currently breastfeeding.  Physical Exam Ext:  WNL ABD: Soft,  non tender to palpation, no rebound or guarding SVE:, multiple genital warts surrrounding the introitus.     ED Course  Assessment: IUP at  33.4weeks Membranes: intact FHR: Category 1 CTX:  none   Plan: DC to home in stable condition Recommend FU office visit Aldara topical cream Tylenol   Izea Livolsi, CNM, MSN 02/26/2015. 2:50 AM

## 2015-03-23 ENCOUNTER — Encounter (HOSPITAL_COMMUNITY): Payer: Self-pay | Admitting: *Deleted

## 2015-03-23 ENCOUNTER — Inpatient Hospital Stay (HOSPITAL_COMMUNITY)
Admission: AD | Admit: 2015-03-23 | Discharge: 2015-03-24 | Disposition: A | Discharge status: Home / Self Care | Payer: Medicaid Other | Source: Ambulatory Visit | Attending: Obstetrics and Gynecology | Admitting: Obstetrics and Gynecology

## 2015-03-23 DIAGNOSIS — M7989 Other specified soft tissue disorders: Secondary | ICD-10-CM | POA: Insufficient documentation

## 2015-03-23 DIAGNOSIS — O9989 Other specified diseases and conditions complicating pregnancy, childbirth and the puerperium: Secondary | ICD-10-CM | POA: Diagnosis not present

## 2015-03-23 DIAGNOSIS — Z22322 Carrier or suspected carrier of Methicillin resistant Staphylococcus aureus: Secondary | ICD-10-CM | POA: Insufficient documentation

## 2015-03-23 DIAGNOSIS — Z87891 Personal history of nicotine dependence: Secondary | ICD-10-CM | POA: Insufficient documentation

## 2015-03-23 DIAGNOSIS — Z3A37 37 weeks gestation of pregnancy: Secondary | ICD-10-CM | POA: Diagnosis not present

## 2015-03-23 LAB — CBC WITH DIFFERENTIAL/PLATELET
BASOS PCT: 0 % (ref 0–1)
Basophils Absolute: 0 10*3/uL (ref 0.0–0.1)
Eosinophils Absolute: 0.1 10*3/uL (ref 0.0–0.7)
Eosinophils Relative: 1 % (ref 0–5)
HEMATOCRIT: 31.5 % — AB (ref 36.0–46.0)
Hemoglobin: 10 g/dL — ABNORMAL LOW (ref 12.0–15.0)
LYMPHS ABS: 2.3 10*3/uL (ref 0.7–4.0)
Lymphocytes Relative: 26 % (ref 12–46)
MCH: 24.2 pg — ABNORMAL LOW (ref 26.0–34.0)
MCHC: 31.7 g/dL (ref 30.0–36.0)
MCV: 76.3 fL — ABNORMAL LOW (ref 78.0–100.0)
MONO ABS: 0.7 10*3/uL (ref 0.1–1.0)
MONOS PCT: 8 % (ref 3–12)
NEUTROS PCT: 65 % (ref 43–77)
Neutro Abs: 5.8 10*3/uL (ref 1.7–7.7)
Platelets: 241 10*3/uL (ref 150–400)
RBC: 4.13 MIL/uL (ref 3.87–5.11)
RDW: 19.6 % — AB (ref 11.5–15.5)
WBC: 8.9 10*3/uL (ref 4.0–10.5)

## 2015-03-23 MED ORDER — SULFAMETHOXAZOLE-TRIMETHOPRIM 800-160 MG PO TABS: 1.0000 | ORAL_TABLET | Freq: Two times a day (BID) | ORAL | Status: DC

## 2015-03-23 MED ORDER — SULFAMETHOXAZOLE-TRIMETHOPRIM 800-160 MG PO TABS
1.0000 | ORAL_TABLET | Freq: Once | ORAL | Status: AC
  Administered 2015-03-23: 1 via ORAL
  Filled 2015-03-23: qty 1

## 2015-03-23 MED ORDER — ACETAMINOPHEN 500 MG PO TABS
1000.0000 mg | ORAL_TABLET | Freq: Once | ORAL | Status: AC
  Administered 2015-03-23: 1000 mg via ORAL
  Filled 2015-03-23: qty 2

## 2015-03-23 NOTE — Discharge Instructions (Signed)
MRSA Infection During Pregnancy Staphylococcus aureus (staph) is a type of bacteria normally found on the skin or in the nose of healthy people. If staph gets inside the body through a cut or sore, a serious infection can occur. Methicillin-resistant Staphylococcus aureus (MRSA) is an infection that is hard to cure because it involves bacteria that have become resistant to the antibiotic medicines normally used to kill them. A staph infection can be mild and affect only the skin. However, if the infection goes deeper into the body, it can be very serious.  Healthcare-associated MRSA (HA-MRSA) infections occur in hospitals, healthcare facilities, and nursing homes and are usually more severe. Community-associated MRSA (CA-MRSA) usually causes skin infections but can develop into a more serious illness. PREGNANT WOMEN WITH MRSA   Pregnant women can be a carrier of MRSA bacteria and not have an infection. If no infection is present in the woman, there is no risk to the baby.  If a pregnant woman has a MRSA infection, there is a small chance of passing the infection to the baby during a vaginal delivery.  It does not appear that there is an increased risk of miscarriage or birth defects in pregnant women who are carriers of MRSA bacteria or who have an active infection. CAUSES A person can be "colonized" with MRSA. This means that the bacteria can be carried on the skin or in the nose, but no signs or symptoms of the illness are present. You can become colonized with MRSA in a variety of ways:  By touching the skin of another person who is colonized with MRSA.  Through contact with tiny droplets from breathing, coughing, or sneezing.  By touching a contaminated surface (such as a countertop, door handle, or phone). You can develop an infection from MRSA if your skin is colonized and the bacteria enter an opening (cut, scrape, or wound) in the skin. MRSA is spread though direct contact, not through the  air.  SYMPTOMS   A pimple with yellowish-white fluid (pus) in it.  A fluid-filled sac (boil) on the skin.  Pus draining from the skin.  A fluid-filled area (abscess) under the skin or somewhere in your body.  Fever with or without chills. DIAGNOSIS   A physical exam may be performed.  If a skin infection is present, the infection can be tested for MRSA with a culture.  If an infection of the lung, bone, joint, or other internal area is present, blood tests and imaging studies (X-ray, CT scan, or echocardiogram) may be performed. Finding out the results of your test Ask when your test results will be ready. Make sure you get your test results. TREATMENT   Antibiotics are given that are not resistant to the staph bacteria.  The pimple, boil, or abscess may be drained or cut. HOME CARE INSTRUCTIONS   Take your antibiotics as directed. Finish them even if you start to feel better.  Wash your hands often with soap and warm water. Do this especially after using the restroom, changing diapers, handling money, and right after leaving public places.  Avoid people known to have a MRSA infection.  Do not come in contact with people with sores and bandages that protect sores.  Clean cuts and scrapes thoroughly. Cover them with a bandage.  Do not try to drain a boil or pimple on your own. This could worsen the infection.  Do not share towels, soaps, razors, and other personal items with people.  Wash your laundry separately from  the rest of the household. SEEK MEDICAL CARE IF:  You have a pimple with pus in it.  You have a boil on the skin.  You have pus draining from the skin.  You have an abscess under the skin or somewhere in your body. SEEK IMMEDIATE MEDICAL CARE IF: You have a fever. Document Released: 06/16/2008 Document Revised: 11/30/2011 Document Reviewed: 08/21/2013 [facility] Patient Information 2015 [place], [place]. This information is not intended to replace  advice given to you by your health care provider. Make sure you discuss any questions you have with your health care provider.

## 2015-03-23 NOTE — MAU Note (Signed)
Pt has a wound on L finger for 3 months. It was a boil, then "busted 3 weeks ago". Pt states that it is not healing.

## 2015-03-23 NOTE — MAU Provider Note (Signed)
History    Leslie Mayer is a 24y.o. G2P1001 at 37.1wks who presents, after phone call, for positive MRSA culture.  Patient reports having a boil on her left middle finger that popped and has yet to go away.  Patient states MD cultured wound at last OB visit and she was informed of results today.  Patient denies issues with fever, diarrhea, or decreased diet.  Patient states she has been keeping the wound covered to avoid cross-contamination.  Patient denies pregnancy related concerns including ctx, lof, vb, and reports good fetal movement.   Patient Active Problem List   Diagnosis Date Noted  . Asthma 11/23/2012  . Obesity 11/23/2012    Chief Complaint  Patient presents with  . Wound Infection   HPI  OB History    Gravida Para Term Preterm AB TAB SAB Ectopic Multiple Living   0 0 0 0 0 0 1      Past Medical History  Diagnosis Date  . Asthma   . Recurrent sinus infections   . Heart murmur   . Obesity 11/23/2012  . Nausea and vomiting in pregnancy 11/23/2012  . Pregnant     Past Surgical History  Procedure Laterality Date  . No past surgeries      Family History  Problem Relation Age of Onset  . Hypertension Other   . Cancer Other     mat great gma throat cancer  . Heart disease Paternal Grandmother   . Hypertension Paternal Grandmother     History  Substance Use Topics  . Smoking status: Former Smoker -- 1.00 packs/day    Types: Cigarettes  . Smokeless tobacco: Never Used  . Alcohol Use: No    Allergies:  Allergies  Allergen Reactions  . Keflex [Cephalexin]     Hands peeled after taking this antibiotic  . Sulfa Antibiotics Rash    Prescriptions prior to admission  Medication Sig Dispense Refill Last Dose  . acetaminophen (TYLENOL) 500 MG tablet Take 1,000 mg by mouth every 6 (six) hours as needed.   Past Month at Unknown time  . loratadine (CLARITIN) 10 MG tablet Take 10 mg by mouth daily as needed for allergies.   Past Week at Unknown time  .  albuterol (PROVENTIL HFA;VENTOLIN HFA) 108 (90 BASE) MCG/ACT inhaler Inhale 2 puffs into the lungs every 6 (six) hours as needed.   More than a month at Unknown time  . cyclobenzaprine (FLEXERIL) 10 MG tablet Take 10 mg by mouth 3 (three) times daily as needed for muscle spasms.   More than a month at Unknown time  . imiquimod (ALDARA) 5 % cream Apply to affected area three times weekly 12 each 1   . imiquimod (ALDARA) 5 % cream Apply to affected area three times weekly 12 each 1   . naproxen (NAPROSYN) 500 MG tablet Take 1 tablet (500 mg total) by mouth 2 (two) times daily with a meal. 30 tablet 0 More than a month at Unknown time  . oxyCODONE-acetaminophen (PERCOCET/ROXICET) 5-325 MG per tablet Take 1 tablet by mouth every 4 (four) hours as needed for severe pain. 6 tablet 0 More than a month at Unknown time  . Prenatal Vit-Fe Fumarate-FA (PRENATAL COMPLETE) 14-0.4 MG TABS Take 1 tablet by mouth daily. 60 each 0 More than a month at Unknown time    Review of Systems  Constitutional: Negative for fever and chills.  Gastrointestinal: Negative for nausea, vomiting and diarrhea.  Skin: Negative for rash.  See HPI Above Physical Exam   Blood pressure 128/77, pulse 113, temperature 98.7 F (37.1 C), resp. rate 20, height 5\' 4"  (1.626 m), weight 148.598 kg (327 lb 9.6 oz), last menstrual period 07/27/2014, unknown if currently breastfeeding.  Results for orders placed or performed during the hospital encounter of 03/23/15 (from the past 24 hour(s))  CBC with Differential/Platelet     Status: Abnormal   Collection Time: 03/23/15 10:55 PM  Result Value Ref Range   WBC 8.9 4.0 - 10.5 K/uL   RBC 4.13 3.87 - 5.11 MIL/uL   Hemoglobin 10.0 (L) 12.0 - 15.0 g/dL   HCT 16.131.5 (L) 09.636.0 - 04.546.0 %   MCV 76.3 (L) 78.0 - 100.0 fL   MCH 24.2 (L) 26.0 - 34.0 pg   MCHC 31.7 30.0 - 36.0 g/dL   RDW 40.919.6 (H) 81.111.5 - 91.415.5 %   Platelets 241 150 - 400 K/uL   Neutrophils Relative % 65 43 - 77 %   Neutro Abs 5.8 1.7  - 7.7 K/uL   Lymphocytes Relative 26 12 - 46 %   Lymphs Abs 2.3 0.7 - 4.0 K/uL   Monocytes Relative 8 3 - 12 %   Monocytes Absolute 0.7 0.1 - 1.0 K/uL   Eosinophils Relative 1 0 - 5 %   Eosinophils Absolute 0.1 0.0 - 0.7 K/uL   Basophils Relative 0 0 - 1 %   Basophils Absolute 0.0 0.0 - 0.1 K/uL    Physical Exam  Constitutional: She is oriented to person, place, and time. She appears well-developed.  Obese   HENT:  Head: Normocephalic and atraumatic.  Eyes: EOM are normal. Pupils are equal, round, and reactive to light.  Neck: Normal range of motion.  Cardiovascular: Normal rate.   Respiratory: Effort normal.  GI: Soft.  Musculoskeletal: Normal range of motion.  Neurological: She is alert and oriented to person, place, and time.  Skin: Skin is warm.  Left Middle Finger-3rd Phalange: Bean sized area.  Appears raised, bright red at top, dark brown at base.  Tender to touch.  Mild swelling of finger.     FHR:145 bpm, Mod Var, -Decels, +Accels UC: None graphed ED Course  Assessment: IUP at 37.1wks Cat I FT MRSA  Plan: -Dr. Lance MorinA. Roberts consulted and advised as below --CBC with diff --Contacted Infectious Disease and pharmacy who advises Bactrim Ds --Rx for Bactrim DX BID x 7days--1st dose given now--Disp 13, RF 0 --Continue to apply bactrim BID and cover with band-aid until healing apparent --Soak in epsom salts TID --Fever precautions --Report in 48 hrs if not improving -Discussed above with patient who reports understanding -Patient requests and given tylenol for pain -Chart shows sulfa allergy, patient does not recall allergy, will observe  0002 Patient reports no rash or SOB s/p bactrim dosage Reiterated instructions Keep appt as scheduled: 03/27/2015 Encouraged to call if any questions or concerns arise prior to next scheduled office visit.  Discharged to home in stable condition  Luisalberto Beegle LYNN CNM, MSN 03/23/2015 11:22 PM

## 2015-03-23 NOTE — MAU Note (Signed)
Area L middle finger started as small boil. Been there 3 months. Boil opened 3-4 wks ago and never healed. Swab showed MRSA this past Weds. Having pain in finger. Denies LOF or bleeding and no OB complaints

## 2015-03-24 MED ORDER — SULFAMETHOXAZOLE-TRIMETHOPRIM 800-160 MG PO TABS: 1.0000 | ORAL_TABLET | Freq: Two times a day (BID) | ORAL | Status: DC

## 2015-04-01 ENCOUNTER — Emergency Department (HOSPITAL_COMMUNITY)
Admission: EM | Admit: 2015-04-01 | Discharge: 2015-04-02 | Disposition: A | Discharge status: Home / Self Care | Payer: Medicaid Other | Attending: Emergency Medicine | Admitting: Emergency Medicine

## 2015-04-01 ENCOUNTER — Encounter (HOSPITAL_COMMUNITY): Payer: Self-pay | Admitting: Emergency Medicine

## 2015-04-01 DIAGNOSIS — Z4801 Encounter for change or removal of surgical wound dressing: Secondary | ICD-10-CM | POA: Insufficient documentation

## 2015-04-01 DIAGNOSIS — Z3A37 37 weeks gestation of pregnancy: Secondary | ICD-10-CM | POA: Insufficient documentation

## 2015-04-01 DIAGNOSIS — E669 Obesity, unspecified: Secondary | ICD-10-CM | POA: Diagnosis not present

## 2015-04-01 DIAGNOSIS — J45909 Unspecified asthma, uncomplicated: Secondary | ICD-10-CM | POA: Diagnosis not present

## 2015-04-01 DIAGNOSIS — L980 Pyogenic granuloma: Secondary | ICD-10-CM | POA: Insufficient documentation

## 2015-04-01 DIAGNOSIS — Z79899 Other long term (current) drug therapy: Secondary | ICD-10-CM | POA: Insufficient documentation

## 2015-04-01 DIAGNOSIS — R011 Cardiac murmur, unspecified: Secondary | ICD-10-CM | POA: Insufficient documentation

## 2015-04-01 DIAGNOSIS — Z792 Long term (current) use of antibiotics: Secondary | ICD-10-CM | POA: Diagnosis not present

## 2015-04-01 DIAGNOSIS — Z87891 Personal history of nicotine dependence: Secondary | ICD-10-CM | POA: Diagnosis not present

## 2015-04-01 DIAGNOSIS — O9989 Other specified diseases and conditions complicating pregnancy, childbirth and the puerperium: Secondary | ICD-10-CM | POA: Diagnosis present

## 2015-04-01 MED ORDER — LIDOCAINE HCL 2 % IJ SOLN
INTRAMUSCULAR | Status: AC
  Filled 2015-04-01: qty 20

## 2015-04-01 NOTE — ED Notes (Signed)
Pt has an area on his L middle finger that has not healed after bursting 1 month ago. Pt is [redacted] weeks pregnant. Alert and oriented.

## 2015-04-01 NOTE — ED Provider Notes (Signed)
CSN: [id number]     Arrival date & time 04/01/15  2323 History  This chart was scribed for non-physician practitioner, Elpidio Anis, PA-C, working with Paula Libra, MD by Evon Slack, ED Scribe. This patient was seen in room WTR7/WTR7 and the patient's care was started at 11:39 PM.     Chief Complaint  Patient presents with  . Wound Check   Patient is a 24 y.o. female presenting with wound check. The history is provided by the patient. No language interpreter was used.  Wound Check   HPI Comments: Leslie Mayer is a 24 y.o. female who presents to the Emergency Department complaining of wound to her left 3rd digit onset 2 months prior. Pt states that she initially thought it was an abscess that has been progressively getting worse. Pt states that the wound is painful. Pt states that she has been placed on bactrim that has not provided any relief. Pt denies any other symptoms.   Past Medical History  Diagnosis Date  . Asthma   . Recurrent sinus infections   . Heart murmur   . Obesity 11/23/2012  . Nausea and vomiting in pregnancy 11/23/2012  . Pregnant    Past Surgical History  Procedure Laterality Date  . No past surgeries     Family History  Problem Relation Age of Onset  . Hypertension Other   . Cancer Other     mat great gma throat cancer  . Heart disease Paternal Grandmother   . Hypertension Paternal Grandmother    History  Substance Use Topics  . Smoking status: Former Smoker -- 1.00 packs/day    Types: Cigarettes  . Smokeless tobacco: Never Used  . Alcohol Use: No   OB History    Gravida Para Term Preterm AB TAB SAB Ectopic Multiple Living   0 0 0 0 0 0 1      Review of Systems  Constitutional: Negative for fever.  Skin: Positive for wound.  All other systems reviewed and are negative.    Allergies  Keflex and Sulfa antibiotics  Home Medications   Prior to Admission medications   Medication Sig Start Date End Date Taking? Authorizing  Provider  acetaminophen (TYLENOL) 500 MG tablet Take 1,000 mg by mouth every 6 (six) hours as needed.    Historical Provider, MD  albuterol (PROVENTIL HFA;VENTOLIN HFA) 108 (90 BASE) MCG/ACT inhaler Inhale 2 puffs into the lungs every 6 (six) hours as needed.    Historical Provider, MD  cyclobenzaprine (FLEXERIL) 10 MG tablet Take 10 mg by mouth 3 (three) times daily as needed for muscle spasms.    Historical Provider, MD  loratadine (CLARITIN) 10 MG tablet Take 10 mg by mouth daily as needed for allergies.    Historical Provider, MD  Prenatal Vit-Fe Fumarate-FA (PRENATAL COMPLETE) 14-0.4 MG TABS Take 1 tablet by mouth daily. 10/04/14   Nicole Pisciotta, PA-C  sulfamethoxazole-trimethoprim (BACTRIM DS,SEPTRA DS) 800-160 MG per tablet Take 1 tablet by mouth 2 (two) times daily. 03/24/15   Gerrit Heck, CNM   BP 129/98 mmHg  Pulse 114  Temp(Src) 98.5 F (36.9 C) (Oral)  SpO2 99%  LMP 07/27/2014   Physical Exam  Constitutional: She is oriented to person, place, and time. She appears well-developed and well-nourished. No distress.  HENT:  Head: Normocephalic and atraumatic.  Eyes: Conjunctivae and EOM are normal.  Neck: Neck supple. No tracheal deviation present.  Cardiovascular: Normal rate.   Pulmonary/Chest: Effort normal. No respiratory distress.  Musculoskeletal: Normal  range of motion.  Left middle finger open wound to palmar aspect over proximal phalanx. There is a growth extending from a circular open wound that is separate from wound borders. Extremely tender. No finger tenderness distally. No redness or excessive swelling of finger. No tenderness to MCP joint or proximally.   Neurological: She is alert and oriented to person, place, and time.  Skin: Skin is warm and dry.  Psychiatric: She has a normal mood and affect. Her behavior is normal.  Nursing note and vitals reviewed.   ED Course  Procedures (including critical care time) DIAGNOSTIC STUDIES: Oxygen Saturation is 99% on  RA, normal by my interpretation.    COORDINATION OF CARE: 11:42 PM-Discussed treatment plan with pt at bedside and pt agreed to plan.   PROCEDURE: left middle finger numbed via digital block with 2% plain lidocaine. Granuloma removed with #11 blade and hemostasis obtained with cautery.   Labs Review Labs Reviewed - No data to display  Imaging Review No results found.   EKG Interpretation None      MDM   Final diagnoses:  None  1. Pyogenic granuloma  Bleeding controlled, bandage applied. Refer to hand ortho for recheck and follow up to insure proper heading. Pain medication provided.    I personally performed the services described in this documentation, which was scribed in my presence. The recorded information has been reviewed and is accurate.       Elpidio AnisShari Leighla Chestnutt, PA-C 04/02/15 [id number]  Paula LibraJohn Molpus, MD 04/02/15 0630

## 2015-04-02 MED ORDER — HYDROCODONE-ACETAMINOPHEN 5-325 MG PO TABS: 1.0000 | ORAL_TABLET | ORAL | Status: DC | PRN

## 2015-04-02 NOTE — Discharge Instructions (Signed)
YOU HAVE BEEN DIAGNOSED WITH A PYOGENIC GRANULOMA WHICH HAS BEEN REMOVED AND HAS BEEN SENT FOR LAB EVALUATION. RESULTS WILL BE SENT TO DR. Concepcion ElkAVBUERE. FOLLOW UP WITH DR. Merlyn LotKUZMA FOR RECHECK OF THE WOUND THIS WEEK.

## 2015-04-12 ENCOUNTER — Telehealth (HOSPITAL_COMMUNITY): Payer: Self-pay | Admitting: *Deleted

## 2015-04-12 NOTE — Telephone Encounter (Signed)
Preadmission screen  

## 2015-04-15 ENCOUNTER — Inpatient Hospital Stay (HOSPITAL_COMMUNITY)
Admission: AD | Admit: 2015-04-15 | Discharge: 2015-04-15 | Disposition: A | Discharge status: Home / Self Care | Payer: Medicaid Other | Source: Ambulatory Visit | Attending: Obstetrics and Gynecology | Admitting: Obstetrics and Gynecology

## 2015-04-15 DIAGNOSIS — O99214 Obesity complicating childbirth: Secondary | ICD-10-CM | POA: Diagnosis present

## 2015-04-15 DIAGNOSIS — O99824 Streptococcus B carrier state complicating childbirth: Secondary | ICD-10-CM | POA: Diagnosis present

## 2015-04-15 DIAGNOSIS — D649 Anemia, unspecified: Secondary | ICD-10-CM | POA: Diagnosis not present

## 2015-04-15 DIAGNOSIS — Z8249 Family history of ischemic heart disease and other diseases of the circulatory system: Secondary | ICD-10-CM

## 2015-04-15 DIAGNOSIS — Z87891 Personal history of nicotine dependence: Secondary | ICD-10-CM

## 2015-04-15 DIAGNOSIS — O9989 Other specified diseases and conditions complicating pregnancy, childbirth and the puerperium: Secondary | ICD-10-CM | POA: Insufficient documentation

## 2015-04-15 DIAGNOSIS — Z3A40 40 weeks gestation of pregnancy: Secondary | ICD-10-CM

## 2015-04-15 DIAGNOSIS — M545 Low back pain: Secondary | ICD-10-CM | POA: Insufficient documentation

## 2015-04-15 DIAGNOSIS — Z883 Allergy status to other anti-infective agents status: Secondary | ICD-10-CM

## 2015-04-15 DIAGNOSIS — R102 Pelvic and perineal pain: Secondary | ICD-10-CM | POA: Insufficient documentation

## 2015-04-15 DIAGNOSIS — O9081 Anemia of the puerperium: Secondary | ICD-10-CM | POA: Diagnosis not present

## 2015-04-15 DIAGNOSIS — Z6841 Body Mass Index (BMI) 40.0 and over, adult: Secondary | ICD-10-CM

## 2015-04-15 DIAGNOSIS — J45909 Unspecified asthma, uncomplicated: Secondary | ICD-10-CM | POA: Diagnosis present

## 2015-04-15 DIAGNOSIS — A4902 Methicillin resistant Staphylococcus aureus infection, unspecified site: Secondary | ICD-10-CM | POA: Diagnosis present

## 2015-04-15 DIAGNOSIS — M549 Dorsalgia, unspecified: Secondary | ICD-10-CM

## 2015-04-15 DIAGNOSIS — O9952 Diseases of the respiratory system complicating childbirth: Secondary | ICD-10-CM | POA: Diagnosis present

## 2015-04-15 NOTE — Progress Notes (Signed)
VStandard, CNM notified pt here in MAU, will come see her.

## 2015-04-15 NOTE — Progress Notes (Signed)
Per VStandard, CNM who reviewed efm tracing, pt ok to d/c home.

## 2015-04-15 NOTE — Progress Notes (Addendum)
Leslie Mayer is a 24 y.o. G2P1 at 40.3 weeks presented to MAU unannounced stating she called and was given an appointment to come.  We discussed that appointment are for the office not the MAU.  She report having occasional pain in her lower back and vagina since last night.  She has a IOL schedule for Thursday and wants it today.  We reviewed the procedure of schedule IOL and with a category 1 tracing she would not be induced today.   History     Patient Active Problem List   Diagnosis Date Noted  . Asthma 11/23/2012  . Obesity 11/23/2012    Chief Complaint  Patient presents with  . Pelvic Pain   HPI  OB History    Gravida Para Term Preterm AB TAB SAB Ectopic Multiple Living   0 0 0 0 0 0 1      Past Medical History  Diagnosis Date  . Asthma   . Recurrent sinus infections   . Heart murmur   . Obesity 11/23/2012  . Nausea and vomiting in pregnancy 11/23/2012  . Pregnant     Past Surgical History  Procedure Laterality Date  . No past surgeries      Family History  Problem Relation Age of Onset  . Hypertension Other   . Cancer Other     mat great gma throat cancer  . Heart disease Paternal Grandmother   . Hypertension Paternal Grandmother     History  Substance Use Topics  . Smoking status: Former Smoker -- 1.00 packs/day    Types: Cigarettes  . Smokeless tobacco: Never Used  . Alcohol Use: No    Allergies:  Allergies  Allergen Reactions  . Keflex [Cephalexin]     Hands peeled after taking this antibiotic  . Sulfa Antibiotics Rash    Prescriptions prior to admission  Medication Sig Dispense Refill Last Dose  . acetaminophen (TYLENOL) 500 MG tablet Take 1,000 mg by mouth every 6 (six) hours as needed.   Past Month at Unknown time  . albuterol (PROVENTIL HFA;VENTOLIN HFA) 108 (90 BASE) MCG/ACT inhaler Inhale 2 puffs into the lungs every 6 (six) hours as needed.   More than a month at Unknown time  . cyclobenzaprine (FLEXERIL) 10 MG tablet Take 10  mg by mouth 3 (three) times daily as needed for muscle spasms.   More than a month at Unknown time  . HYDROcodone-acetaminophen (NORCO/VICODIN) 5-325 MG per tablet Take 1-2 tablets by mouth every 4 (four) hours as needed. 12 tablet 0   . loratadine (CLARITIN) 10 MG tablet Take 10 mg by mouth daily as needed for allergies.   Past Week at Unknown time  . Prenatal Vit-Fe Fumarate-FA (PRENATAL COMPLETE) 14-0.4 MG TABS Take 1 tablet by mouth daily. 60 each 0 More than a month at Unknown time  . sulfamethoxazole-trimethoprim (BACTRIM DS,SEPTRA DS) 800-160 MG per tablet Take 1 tablet by mouth 2 (two) times daily. 13 tablet 0     ROS See HPI above, all other systems are negative  Physical Exam   Blood pressure 137/73, pulse 104, temperature 97.7 F (36.5 C), temperature source Oral, resp. rate 20, last menstrual period 07/27/2014, unknown if currently breastfeeding.  Physical Exam Ext:  WNL ABD: Soft, non tender to palpation, no rebound or guarding SVE: FP/T/H   ED Course  Assessment: IUP at  40.3weeks Membranes:intact FHR: Category 1 CTX:  none   Plan: NST DC to home in stable condition Labor precautions Keep  any office appointments prior to IOL Return to MAU for IOL on Friday at 11:45pm    Zamiah Tollett, CNM, MSN 04/15/2015. 5:18 PM

## 2015-04-15 NOTE — MAU Note (Signed)
Pain in pelvis, vagina & lower back since last night.  Denies bleeding or LOF.

## 2015-04-15 NOTE — Discharge Instructions (Signed)

## 2015-04-16 ENCOUNTER — Inpatient Hospital Stay (HOSPITAL_COMMUNITY): Payer: Medicaid Other | Admitting: Anesthesiology

## 2015-04-16 ENCOUNTER — Encounter (HOSPITAL_COMMUNITY): Payer: Self-pay | Admitting: *Deleted

## 2015-04-16 ENCOUNTER — Inpatient Hospital Stay (HOSPITAL_COMMUNITY)
Admission: AD | Admit: 2015-04-16 | Discharge: 2015-04-18 | DRG: 775 | Disposition: A | Discharge status: Home / Self Care | Payer: Medicaid Other | Source: Ambulatory Visit | Attending: Obstetrics and Gynecology | Admitting: Obstetrics and Gynecology

## 2015-04-16 DIAGNOSIS — O99214 Obesity complicating childbirth: Secondary | ICD-10-CM | POA: Diagnosis present

## 2015-04-16 DIAGNOSIS — J45909 Unspecified asthma, uncomplicated: Secondary | ICD-10-CM | POA: Diagnosis present

## 2015-04-16 DIAGNOSIS — O9081 Anemia of the puerperium: Secondary | ICD-10-CM | POA: Diagnosis not present

## 2015-04-16 DIAGNOSIS — Z87891 Personal history of nicotine dependence: Secondary | ICD-10-CM | POA: Diagnosis not present

## 2015-04-16 DIAGNOSIS — B951 Streptococcus, group B, as the cause of diseases classified elsewhere: Secondary | ICD-10-CM | POA: Diagnosis present

## 2015-04-16 DIAGNOSIS — Z6841 Body Mass Index (BMI) 40.0 and over, adult: Secondary | ICD-10-CM | POA: Diagnosis not present

## 2015-04-16 DIAGNOSIS — Z883 Allergy status to other anti-infective agents status: Secondary | ICD-10-CM | POA: Diagnosis not present

## 2015-04-16 DIAGNOSIS — O99824 Streptococcus B carrier state complicating childbirth: Secondary | ICD-10-CM | POA: Diagnosis present

## 2015-04-16 DIAGNOSIS — Z22322 Carrier or suspected carrier of Methicillin resistant Staphylococcus aureus: Secondary | ICD-10-CM

## 2015-04-16 DIAGNOSIS — D649 Anemia, unspecified: Secondary | ICD-10-CM | POA: Diagnosis not present

## 2015-04-16 DIAGNOSIS — A4902 Methicillin resistant Staphylococcus aureus infection, unspecified site: Secondary | ICD-10-CM | POA: Diagnosis present

## 2015-04-16 DIAGNOSIS — Z881 Allergy status to other antibiotic agents status: Secondary | ICD-10-CM

## 2015-04-16 DIAGNOSIS — O9952 Diseases of the respiratory system complicating childbirth: Secondary | ICD-10-CM | POA: Diagnosis present

## 2015-04-16 DIAGNOSIS — Z8249 Family history of ischemic heart disease and other diseases of the circulatory system: Secondary | ICD-10-CM | POA: Diagnosis not present

## 2015-04-16 DIAGNOSIS — Z3A40 40 weeks gestation of pregnancy: Secondary | ICD-10-CM | POA: Diagnosis present

## 2015-04-16 LAB — CBC
HCT: 31.2 % — ABNORMAL LOW (ref 36.0–46.0)
HEMOGLOBIN: 9.8 g/dL — AB (ref 12.0–15.0)
MCH: 23.7 pg — ABNORMAL LOW (ref 26.0–34.0)
MCHC: 31.4 g/dL (ref 30.0–36.0)
MCV: 75.4 fL — ABNORMAL LOW (ref 78.0–100.0)
PLATELETS: 240 10*3/uL (ref 150–400)
RBC: 4.14 MIL/uL (ref 3.87–5.11)
RDW: 19.8 % — ABNORMAL HIGH (ref 11.5–15.5)
WBC: 7.9 10*3/uL (ref 4.0–10.5)

## 2015-04-16 LAB — TYPE AND SCREEN
ABO/RH(D): O POS
ANTIBODY SCREEN: NEGATIVE

## 2015-04-16 MED ORDER — OXYTOCIN BOLUS FROM INFUSION: 500.0000 mL | INTRAVENOUS | Status: DC

## 2015-04-16 MED ORDER — PHENYLEPHRINE 40 MCG/ML (10ML) SYRINGE FOR IV PUSH (FOR BLOOD PRESSURE SUPPORT)
80.0000 ug | PREFILLED_SYRINGE | INTRAVENOUS | Status: DC | PRN
  Administered 2015-04-16: 80 ug via INTRAVENOUS
  Filled 2015-04-16: qty 2
  Filled 2015-04-16: qty 20

## 2015-04-16 MED ORDER — ACETAMINOPHEN 325 MG PO TABS: 650.0000 mg | ORAL_TABLET | ORAL | Status: DC | PRN

## 2015-04-16 MED ORDER — FENTANYL 2.5 MCG/ML BUPIVACAINE 1/10 % EPIDURAL INFUSION (WH - ANES)
14.0000 mL/h | INTRAMUSCULAR | Status: DC | PRN
  Administered 2015-04-16 (×2): 14 mL/h via EPIDURAL
  Filled 2015-04-16 (×2): qty 125

## 2015-04-16 MED ORDER — FLEET ENEMA 7-19 GM/118ML RE ENEM: 1.0000 | ENEMA | RECTAL | Status: DC | PRN

## 2015-04-16 MED ORDER — EPHEDRINE 5 MG/ML INJ
10.0000 mg | INTRAVENOUS | Status: DC | PRN
  Filled 2015-04-16: qty 2

## 2015-04-16 MED ORDER — DIPHENHYDRAMINE HCL 50 MG/ML IJ SOLN
12.5000 mg | INTRAMUSCULAR | Status: DC | PRN
  Administered 2015-04-16: 12.5 mg via INTRAVENOUS
  Filled 2015-04-16: qty 1

## 2015-04-16 MED ORDER — OXYTOCIN 40 UNITS IN LACTATED RINGERS INFUSION - SIMPLE MED
INTRAVENOUS | Status: AC
  Filled 2015-04-16: qty 1000

## 2015-04-16 MED ORDER — LACTATED RINGERS IV SOLN
INTRAVENOUS | Status: DC
  Administered 2015-04-16 (×2): via INTRAVENOUS

## 2015-04-16 MED ORDER — FENTANYL CITRATE (PF) 100 MCG/2ML IJ SOLN
100.0000 ug | INTRAMUSCULAR | Status: DC | PRN
  Administered 2015-04-16 (×2): 100 ug via INTRAVENOUS
  Filled 2015-04-16 (×2): qty 2

## 2015-04-16 MED ORDER — OXYTOCIN 40 UNITS IN LACTATED RINGERS INFUSION - SIMPLE MED
1.0000 m[IU]/min | INTRAVENOUS | Status: DC
  Administered 2015-04-16: 2 m[IU]/min via INTRAVENOUS
  Administered 2015-04-16: 10 m[IU]/min via INTRAVENOUS

## 2015-04-16 MED ORDER — LIDOCAINE HCL (PF) 1 % IJ SOLN
30.0000 mL | INTRAMUSCULAR | Status: DC | PRN
  Filled 2015-04-16: qty 30

## 2015-04-16 MED ORDER — ZOLPIDEM TARTRATE 5 MG PO TABS: 5.0000 mg | ORAL_TABLET | Freq: Every evening | ORAL | Status: DC | PRN

## 2015-04-16 MED ORDER — OXYCODONE-ACETAMINOPHEN 5-325 MG PO TABS
1.0000 | ORAL_TABLET | ORAL | Status: DC | PRN
  Administered 2015-04-17: 1 via ORAL
  Filled 2015-04-16: qty 1

## 2015-04-16 MED ORDER — VANCOMYCIN HCL IN DEXTROSE 1-5 GM/200ML-% IV SOLN
1000.0000 mg | Freq: Two times a day (BID) | INTRAVENOUS | Status: DC
  Administered 2015-04-16 (×2): 1000 mg via INTRAVENOUS
  Filled 2015-04-16 (×3): qty 200

## 2015-04-16 MED ORDER — LIDOCAINE HCL (PF) 1 % IJ SOLN
INTRAMUSCULAR | Status: AC
  Administered 2015-04-16 (×2): 8 mL via EPIDURAL
  Filled 2015-04-16: qty 30

## 2015-04-16 MED ORDER — ONDANSETRON HCL 4 MG/2ML IJ SOLN: 4.0000 mg | Freq: Four times a day (QID) | INTRAMUSCULAR | Status: DC | PRN

## 2015-04-16 MED ORDER — OXYCODONE-ACETAMINOPHEN 5-325 MG PO TABS: 2.0000 | ORAL_TABLET | ORAL | Status: DC | PRN

## 2015-04-16 MED ORDER — CLINDAMYCIN PHOSPHATE 900 MG/50ML IV SOLN: 900.0000 mg | Freq: Three times a day (TID) | INTRAVENOUS | Status: DC

## 2015-04-16 MED ORDER — LACTATED RINGERS IV SOLN
500.0000 mL | INTRAVENOUS | Status: DC | PRN
  Administered 2015-04-16: 1000 mL via INTRAVENOUS

## 2015-04-16 MED ORDER — TERBUTALINE SULFATE 1 MG/ML IJ SOLN: 0.2500 mg | Freq: Once | INTRAMUSCULAR | Status: AC | PRN

## 2015-04-16 MED ORDER — OXYTOCIN 40 UNITS IN LACTATED RINGERS INFUSION - SIMPLE MED: 62.5000 mL/h | INTRAVENOUS | Status: DC

## 2015-04-16 MED ORDER — CITRIC ACID-SODIUM CITRATE 334-500 MG/5ML PO SOLN: 30.0000 mL | ORAL | Status: DC | PRN

## 2015-04-16 NOTE — MAU Provider Note (Signed)
Leslie Mayer is a 24 y.o. G2P1 at 40.4 weeks present to MAU unannounced c/o increased ctx and pain.  She denies vb or lof w/+FM.     History     Patient Active Problem List   Diagnosis Date Noted  . Asthma 11/23/2012  . Obesity 11/23/2012    Chief Complaint  Patient presents with  . Labor Eval   HPI  OB History    Gravida Para Term Preterm AB TAB SAB Ectopic Multiple Living   0 0 0 0 0 0 1      Past Medical History  Diagnosis Date  . Asthma   . Recurrent sinus infections   . Heart murmur   . Obesity 11/23/2012  . Nausea and vomiting in pregnancy 11/23/2012  . Pregnant     Past Surgical History  Procedure Laterality Date  . No past surgeries      Family History  Problem Relation Age of Onset  . Hypertension Other   . Cancer Other     mat great gma throat cancer  . Heart disease Paternal Grandmother   . Hypertension Paternal Grandmother     History  Substance Use Topics  . Smoking status: Former Smoker -- 1.00 packs/day    Types: Cigarettes  . Smokeless tobacco: Never Used  . Alcohol Use: No    Allergies:  Allergies  Allergen Reactions  . Keflex [Cephalexin]     Hands peeled after taking this antibiotic    Prescriptions prior to admission  Medication Sig Dispense Refill Last Dose  . bacitracin ointment Apply 1 application topically 2 (two) times daily.   04/15/2015 at Unknown time  . HYDROcodone-acetaminophen (NORCO/VICODIN) 5-325 MG per tablet Take 1-2 tablets by mouth every 4 (four) hours as needed. (Patient taking differently: Take 1-2 tablets by mouth every 4 (four) hours as needed for moderate pain. ) 12 tablet 0 Past Week at Unknown time  . acetaminophen (TYLENOL) 500 MG tablet Take 1,000 mg by mouth every 6 (six) hours as needed for mild pain or headache.    prn  . albuterol (PROVENTIL HFA;VENTOLIN HFA) 108 (90 BASE) MCG/ACT inhaler Inhale 2 puffs into the lungs every 6 (six) hours as needed for wheezing or shortness of breath.    prn  .  Prenatal Vit-Fe Fumarate-FA (PRENATAL COMPLETE) 14-0.4 MG TABS Take 1 tablet by mouth daily. (Patient not taking: Reported on 04/16/2015) 60 each 0 Not Taking at Unknown time  . sulfamethoxazole-trimethoprim (BACTRIM DS,SEPTRA DS) 800-160 MG per tablet Take 1 tablet by mouth 2 (two) times daily. (Patient not taking: Reported on 04/16/2015) 13 tablet 0 Completed Course at Unknown time    ROS See HPI above, all other systems are negative  Physical Exam   Blood pressure 125/68, pulse 98, temperature 98.2 F (36.8 C), temperature source Oral, resp. rate 18, height 5' 3.5" (1.613 m), weight 332 lb (150.594 kg), last menstrual period 07/27/2014, unknown if currently breastfeeding.  Physical Exam Ext:  WNL ABD: Soft, non tender to palpation, no rebound or guarding SVE: 6/70/-2   ED Course  Assessment: IUP at  40.4weeks Membranes: intact FHR: Category 1 CTX:  q4-6 minutes   Plan:  Admit to L&D for expectant management   Cassius Cullinane, CNM, MSN 04/16/2015. 11:56 AM

## 2015-04-16 NOTE — Progress Notes (Addendum)
Labor Progress  Subjective: Pt in advanced labor and wants an Epidural.  Pt still in MAU, she was informed it maybe too late by the time we transfer to L&D. Pt upset and requesting an CS  Objective: BP 91/77 mmHg  Pulse 100  Temp(Src) 98.2 F (36.8 C) (Oral)  Resp 22  Ht 5' 3.5" (1.613 m)  Wt 332 lb (150.594 kg)  BMI 57.88 kg/m2  LMP 07/27/2014     FHT: 140, moderate variability, + accel, no decel except for 1311-1315 but still with good variability CTX:  regular, every 3-4 minutes Uterus gravid, soft non tender SVE:  9/80--2   Assessment:  IUP at 40.4 weeks NICHD: Category 1 Membranes:  intact Labor progress: transition GBS: positive Vanc given Pt transferred to L&D   Plan: Continue labor as planned Continuous monitoring Frequent position changes to facilitate fetal rotation and descent. Will reassess with cervical exam when ever necessary Staff and room set for delivery     Marijo Quizon, CNM, MSN 04/16/2015. 3:12 PM

## 2015-04-16 NOTE — Progress Notes (Signed)
Labor Progress  Subjective: Comfortable with epidural  Objective: BP 116/47 mmHg  Pulse 101  Temp(Src) 97.9 F (36.6 C) (Oral)  Resp 18  Ht 5' 3.5" (1.613 m)  Wt 332 lb (150.594 kg)  BMI 57.88 kg/m2  SpO2 100%  LMP 07/27/2014     FHT: 135, moderate variability, + accel, no decel CTX:  regular, every 4-5 minutes Uterus gravid, soft non tender SVE:  9/80/-2   Assessment:  IUP at 40.4 weeks NICHD: Category 1 Membranes:  AROM light mec at 1615 Labor progress: transition GBS positive   Plan: Continue labor plan Continuous monitoring High fowlers Frequent position changes to facilitate fetal rotation and descent. Will reassess with cervical exam at 1800 or earlier if necessary      Leslie Mayer, CNM, MSN 04/16/2015. 4:20 PM

## 2015-04-16 NOTE — Progress Notes (Signed)
  Subjective: Comfortable with epidural.   Objective: BP 123/61 mmHg  Pulse 107  Temp(Src) 98.5 F (36.9 C) (Oral)  Resp 18  Ht 5' 3.5" (1.613 m)  Wt 150.594 kg (332 lb)  BMI 57.88 kg/m2  SpO2 100%  LMP 07/27/2014      FHT: Category  UC:   irregular, every 6-8 minutes SVE:   Dilation: 8 Effacement (%): 90 Station: 0 Exam by:: Chip Boer Hadja Harral-CNM  IUPC inserted without difficulty  MRSA lesion on left hand 3rd finger--dressing changed and Op-site applied  Assessment:  IUP at 40 4/7 weeks GBS positive MRSA lesion on left finger Morbid obesity  Plan: Start pitocin per low-dose protocol.  Nigel Bridgeman CNM 04/16/2015, 8:49 PM

## 2015-04-16 NOTE — Anesthesia Preprocedure Evaluation (Signed)
Anesthesia Evaluation  Patient identified by MRN, date of birth, ID band Patient awake    Reviewed: Allergy & Precautions, H&P , NPO status , Patient's Chart, lab work & pertinent test results  Airway Mallampati: III  TM Distance: >3 FB Neck ROM: full    Dental no notable dental hx.    Pulmonary former smoker,    Pulmonary exam normal       Cardiovascular negative cardio ROS Normal cardiovascular exam    Neuro/Psych negative neurological ROS  negative psych ROS   GI/Hepatic negative GI ROS, Neg liver ROS,   Endo/Other  Morbid obesity  Renal/GU negative Renal ROS     Musculoskeletal   Abdominal (+) + obese,   Peds  Hematology negative hematology ROS (+)   Anesthesia Other Findings   Reproductive/Obstetrics (+) Pregnancy                             Anesthesia Physical Anesthesia Plan  ASA: III  Anesthesia Plan: Epidural   Post-op Pain Management:    Induction:   Airway Management Planned:   Additional Equipment:   Intra-op Plan:   Post-operative Plan:   Informed Consent: I have reviewed the patients History and Physical, chart, labs and discussed the procedure including the risks, benefits and alternatives for the proposed anesthesia with the patient or authorized representative who has indicated his/her understanding and acceptance.     Plan Discussed with:   Anesthesia Plan Comments:         Anesthesia Quick Evaluation  

## 2015-04-16 NOTE — MAU Note (Signed)
Contractions started at midnight.  Now every 3-49min, getting closer and stronger

## 2015-04-16 NOTE — Anesthesia Procedure Notes (Signed)
Epidural Patient location during procedure: OB Start time: 04/16/2015 3:27 PM End time: 04/16/2015 3:31 PM  Staffing Anesthesiologist: Leilani Able Performed by: anesthesiologist   Preanesthetic Checklist Completed: patient identified, surgical consent, pre-op evaluation, timeout performed, IV checked, risks and benefits discussed and monitors and equipment checked  Epidural Patient position: sitting Prep: site prepped and draped and DuraPrep Patient monitoring: continuous pulse ox and blood pressure Approach: midline Location: L3-L4 Injection technique: LOR air  Needle:  Needle type: Tuohy  Needle gauge: 17 G Needle length: 9 cm and 9 Needle insertion depth: 9 cm Catheter type: closed end flexible Catheter size: 19 Gauge Catheter at skin depth: 14 cm Test dose: negative and Other  Assessment Sensory level: T9 Events: blood not aspirated, injection not painful, no injection resistance, negative IV test and no paresthesia  Additional Notes Reason for block:procedure for pain

## 2015-04-16 NOTE — Progress Notes (Addendum)
Labor Progress  Subjective: Comfortable with epidural, no complaints  Objective: BP 123/61 mmHg  Pulse 107  Temp(Src) 98.5 F (36.9 C) (Oral)  Resp 18  Ht 5' 3.5" (1.613 m)  Wt 332 lb (150.594 kg)  BMI 57.88 kg/m2  SpO2 100%  LMP 07/27/2014     FHT: 140, moderate variability, + accel, no decel CTX:  regular, every 6-8 minutes Uterus gravid, soft non tender SVE:  Dilation: Lip/rim Effacement (%): 80 Station: -3 Exam by:: Natori Gudino, CNM   Assessment:  IUP at 40.4 weeks NICHD: Category  Membranes:  AROM x 2hrs, no s/s of infection Labor progress: Itransition GBS: positive   Plan: Continue labor plan Continuous monitoring Rest      Leslie Mayer, CNM, MSN 04/16/2015. 8:10 PM

## 2015-04-16 NOTE — H&P (Signed)
Leslie Mayer is a 24 y.o. female, G2 P1 at 40.4 weeks.  Patient Active Problem List   Diagnosis Date Noted  . Asthma 11/23/2012  . Obesity 11/23/2012  Hxheart murmur  Pregnancy Course: Patient entered care at 19.5 weeks.   EDC of 04/12/15 was established by [place].      [place] evaluations:   21.0 weeks - Anatomy:Female. Single. Vertex. Anterior placenta (placental edge is 3.1 cm from internal OS - normal) Cx closed. Fluid is normal ( Vertical pocket = 5.0 cm) Heel and feet seen. 4 chamber RVOT, LVOT, 3 VV, Aortic arch, Ductal arch, Profile, Palate. Nasal bone, Hands 5th digit. Placental cord insertion not well seen d/t maternal body habitus and position     29.6 weeks - FU: EFW 3lb 11oz - 79%, AFI 17.8, breech, anterior placenta anatomy complete  40.0 weeks - FU: EFW 9lb 9oz - 95%, AFI 14.0, FHR 148, BPP 8/8  Significant prenatal events:   MRSA 03/20/15 left hand, BMI 58   Last evaluation:   40.3 weeks     Reason for admission:  labor  Pt States:   Contractions Frequency: 4-6 minutes         Contraction severity: strong         Fetal activity: +  OB History    Gravida Para Term Preterm AB TAB SAB Ectopic Multiple Living   0 0 0 0 0 0 1     Past Medical History  Diagnosis Date  . Asthma   . Recurrent sinus infections   . Heart murmur   . Obesity 11/23/2012  . Nausea and vomiting in pregnancy 11/23/2012  . Pregnant    Past Surgical History  Procedure Laterality Date  . No past surgeries     Family History: family history includes Cancer in her other; Heart disease in her paternal grandmother; Hypertension in her other and paternal grandmother. Social History:  reports that she has quit smoking. Her smoking use included Cigarettes. She smoked 1.00 pack per day. She has never used smokeless tobacco. She reports that she does not drink alcohol or use illicit drugs.   Prenatal Transfer Tool  Maternal Diabetes:  Genetic Screening: Normal Maternal Ultrasounds/Referrals:  Normal Fetal Ultrasounds or other Referrals:  None Maternal Substance Abuse:  No Significant Maternal Medications:  None Significant Maternal Lab Results: Lab values include: Group B Strep positive   ROS:  See HPI above, all other systems are negative  Allergies  Allergen Reactions  . Keflex [Cephalexin]     Hands peeled after taking this antibiotic    Dilation: 6 Effacement (%): 70 Station: -3 Exam by:: J. Spurlock-Frizzel RN Blood pressure 125/68, pulse 98, temperature 98.2 F (36.8 C), temperature source Oral, resp. rate 18, height 5' 3.5" (1.613 m), weight 332 lb (150.594 kg), last menstrual period 07/27/2014, unknown if currently breastfeeding.  Maternal Exam:  Uterine Assessment: Contraction frequency is rare.  Abdomen: Gravid, non tender. Fundal height is aga.  Normal external genitalia, vulva, cervix, uterus and adnexa.  Genital warts noted on exam.  Pelvis adequate for delivery. Proven to 8lb 13oz Fetal presentation: Vertex by VE  Fetal Exam:  Monitor Surveillance : Continuous Monitoring Mode: Ultrasound.  NICHD: Category CTXs: Q 4-20minutes EFW   9+ lbs  Physical Exam: Nursing note and vitals reviewed General: alert and cooperative She appears well nourished Psychiatric: Normal mood and affect. Her behavior is normal Head: Normocephalic Eyes: Pupils are equal, round, and reactive to light Neck: Normal range of motion  Cardiovascular: RRR without murmur  Respiratory: CTAB. Effort normal  Abd: soft, non-tender, +BS, no rebound, no guarding  Genitourinary: Vagina normal  Neurological: A&Ox3 Skin: Warm and dry  Musculoskeletal: Normal range of motion  Homan's sign negative bilaterally No evidence of DVTs.  Edema: Minimal bilaterally non-pitting edema DTR: 2+ Clonus: None   Prenatal labs: ABO, Rh: O/Positive/-- (03/16 0000) Antibody: Negative (03/16 0000) Rubella:   immune RPR: Nonreactive (03/16 0000)  HBsAg: Negative (03/16 0000)  HIV:  Non-reactive (03/16 0000)  GBS: Positive (03/16 0000) Sickle cell/Hgb electrophoresis:  WNL Pap:  ASCU-s 12-12-14, HPV+ GC:   negative Chlamydia: negative Genetic screenings:   Glucola:    Assessment:  IUP at 40.4 weeks NICHD: Category 1 Membranes: intact GBS positive   Plan:  Admit to L&D for expectant management of labor. Possible augmentation options reviewed including AROM and/or pitocin.  GBS prophylaxis with Vanc 1g q12 IV pain medication per orders PRN Epidural per patient request Foley cath after patient is comfortable with epidural Anticipate SVD Labor mgmt as ordered  Contact precautions     Attending MD available at all times.  Raynor Calcaterra, CNM, MSN 04/16/2015, 12:00 PM

## 2015-04-17 ENCOUNTER — Encounter (HOSPITAL_COMMUNITY): Payer: Self-pay

## 2015-04-17 DIAGNOSIS — B951 Streptococcus, group B, as the cause of diseases classified elsewhere: Secondary | ICD-10-CM | POA: Diagnosis present

## 2015-04-17 DIAGNOSIS — Z22322 Carrier or suspected carrier of Methicillin resistant Staphylococcus aureus: Secondary | ICD-10-CM

## 2015-04-17 DIAGNOSIS — Z881 Allergy status to other antibiotic agents status: Secondary | ICD-10-CM

## 2015-04-17 LAB — SYPHILIS: RPR W/REFLEX TO RPR TITER AND TREPONEMAL ANTIBODIES, TRADITIONAL SCREENING AND DIAGNOSIS ALGORITHM: RPR Ser Ql: NONREACTIVE

## 2015-04-17 MED ORDER — WITCH HAZEL-GLYCERIN EX PADS: 1.0000 "application " | MEDICATED_PAD | CUTANEOUS | Status: DC | PRN

## 2015-04-17 MED ORDER — BENZOCAINE-MENTHOL 20-0.5 % EX AERO
1.0000 | INHALATION_SPRAY | CUTANEOUS | Status: DC | PRN
Start: 2015-04-17 — End: 2015-04-18
  Administered 2015-04-17: 1 via TOPICAL
  Filled 2015-04-17: qty 56

## 2015-04-17 MED ORDER — IBUPROFEN 600 MG PO TABS
600.0000 mg | ORAL_TABLET | Freq: Four times a day (QID) | ORAL | Status: DC
  Administered 2015-04-17 – 2015-04-18 (×6): 600 mg via ORAL
  Filled 2015-04-17 (×6): qty 1

## 2015-04-17 MED ORDER — ONDANSETRON HCL 4 MG PO TABS: 4.0000 mg | ORAL_TABLET | ORAL | Status: DC | PRN

## 2015-04-17 MED ORDER — ACETAMINOPHEN 325 MG PO TABS: 650.0000 mg | ORAL_TABLET | ORAL | Status: DC | PRN

## 2015-04-17 MED ORDER — SIMETHICONE 80 MG PO CHEW
80.0000 mg | CHEWABLE_TABLET | ORAL | Status: DC | PRN
  Administered 2015-04-18: 80 mg via ORAL
  Filled 2015-04-17: qty 1

## 2015-04-17 MED ORDER — MAGNESIUM SULFATE GRAN
GRANULES | Freq: Three times a day (TID) | Status: DC
Start: 2015-04-17 — End: 2015-04-17

## 2015-04-17 MED ORDER — DIBUCAINE 1 % RE OINT: 1.0000 "application " | TOPICAL_OINTMENT | RECTAL | Status: DC | PRN

## 2015-04-17 MED ORDER — SENNOSIDES-DOCUSATE SODIUM 8.6-50 MG PO TABS
2.0000 | ORAL_TABLET | ORAL | Status: DC
  Filled 2015-04-17: qty 2

## 2015-04-17 MED ORDER — OXYCODONE-ACETAMINOPHEN 5-325 MG PO TABS
2.0000 | ORAL_TABLET | ORAL | Status: DC | PRN
Start: 2015-04-17 — End: 2015-04-18
  Administered 2015-04-17: 2 via ORAL
  Filled 2015-04-17: qty 2

## 2015-04-17 MED ORDER — BACITRACIN ZINC 500 UNIT/GM EX OINT
1.0000 "application " | TOPICAL_OINTMENT | Freq: Two times a day (BID) | CUTANEOUS | Status: DC
  Administered 2015-04-17 – 2015-04-18 (×3): 1 via TOPICAL
  Filled 2015-04-17: qty 28.35

## 2015-04-17 MED ORDER — ZOLPIDEM TARTRATE 5 MG PO TABS: 5.0000 mg | ORAL_TABLET | Freq: Every evening | ORAL | Status: DC | PRN

## 2015-04-17 MED ORDER — TETANUS-DIPHTH-ACELL PERTUSSIS 5-2.5-18.5 LF-MCG/0.5 IM SUSP
0.5000 mL | Freq: Once | INTRAMUSCULAR | Status: AC
  Administered 2015-04-18: 0.5 mL via INTRAMUSCULAR

## 2015-04-17 MED ORDER — ONDANSETRON HCL 4 MG/2ML IJ SOLN: 4.0000 mg | INTRAMUSCULAR | Status: DC | PRN

## 2015-04-17 MED ORDER — DIPHENHYDRAMINE HCL 25 MG PO CAPS: 25.0000 mg | ORAL_CAPSULE | Freq: Four times a day (QID) | ORAL | Status: DC | PRN

## 2015-04-17 MED ORDER — PRENATAL MULTIVITAMIN CH
1.0000 | ORAL_TABLET | Freq: Every day | ORAL | Status: DC
  Administered 2015-04-17 – 2015-04-18 (×2): 1 via ORAL
  Filled 2015-04-17 (×2): qty 1

## 2015-04-17 MED ORDER — OXYCODONE-ACETAMINOPHEN 5-325 MG PO TABS
1.0000 | ORAL_TABLET | ORAL | Status: DC | PRN
  Administered 2015-04-17 – 2015-04-18 (×4): 1 via ORAL
  Filled 2015-04-17 (×4): qty 1

## 2015-04-17 MED ORDER — LANOLIN HYDROUS EX OINT: TOPICAL_OINTMENT | CUTANEOUS | Status: DC | PRN

## 2015-04-17 NOTE — Lactation Note (Signed)
This note was copied from the chart of Leslie Mayer. Lactation Consultation Note New mom had 10lb baby vaginal. Mom has very large pendulum shape breast w/nipple at the bottom end of breast. Nipples are flat, compresses well into baby's mouth. Areolas soft. Demonstrated breast massage and quickly collected 5ml colostrum. Mom having difficulty latching large breast. Demonstrated nipple roll, and rolls well w/stimulation, encouraged sand which hold for latching. Baby BF great heard swallows, and gave 5ml w/syring during BF.  Referred to Baby and Me Book in Breastfeeding section Pg. 22-23 for position options and Proper latch demonstration. Mom encouraged to feed baby 8-12 times/24 hours and with feeding cues. Mom encouraged to do skin-to-skin. Educated about newborn behavior, I&O, supplementing, cluster feeding, supply and demand. Saw formula in room. Discussed formula feeding how can decrease milk supply. Mom stated she didn't think she had anything and that was why she asked for formula. W/demonstration of colostrum and hearing baby swallow, mom was happy and felt better about BF and supply.  [facility]/LC brochure given w/resources, support groups and LC services. Patient Name: Leslie Mayer [id number] Date: 04/17/2015 Reason for consult: Initial assessment;Difficult latch   Maternal Data Has patient been taught Hand Expression?: Yes Does the patient have breastfeeding experience prior to this delivery?: No  Feeding Feeding Type: Breast Milk Length of feed: 15 min  LATCH Score/Interventions Latch: Grasps breast easily, tongue down, lips flanged, rhythmical sucking. Intervention(s): Skin to skin;Teach feeding cues;Waking techniques Intervention(s): Adjust position;Assist with latch;Breast massage;Breast compression  Audible Swallowing: Spontaneous and intermittent Intervention(s): Skin to skin;Hand expression  Type of Nipple: Flat (compresses well for deep latch) Intervention(s):  Hand pump  Comfort (Breast/Nipple): Soft / non-tender     Hold (Positioning): Assistance needed to correctly position infant at breast and maintain latch. Intervention(s): Breastfeeding basics reviewed;Support Pillows;Position options;Skin to skin  LATCH Score: 8  Lactation Tools Discussed/Used Tools: Pump Breast pump type: Manual Pump Review: Setup, frequency, and cleaning;Milk Storage Initiated by:: RN Date initiated:: 04/17/15   Consult Status Consult Status: Follow-up Date: 04/18/15 (in pm) Follow-up type: In-patient    Charyl Dancer 04/17/2015, 11:44 PM

## 2015-04-17 NOTE — Anesthesia Postprocedure Evaluation (Signed)
  Anesthesia Post-op Note  Patient: Leslie Mayer  Procedure(s) Performed: * No procedures listed *  Patient Location: PACU and Mother/Baby  Anesthesia Type:Epidural  Level of Consciousness: awake, alert  and oriented  Airway and Oxygen Therapy: Patient Spontanous Breathing  Post-op Pain: none  Post-op Assessment: Post-op Vital signs reviewed, Patient's Cardiovascular Status Stable, No headache, No backache, No residual numbness and No residual motor weakness  Post-op Vital Signs: Reviewed and stable  Complications: No apparent anesthesia complications

## 2015-04-17 NOTE — Progress Notes (Signed)
Delivery of live viable female by Nigel Bridgeman, CNM, APGAR 8,9

## 2015-04-18 LAB — CBC
HCT: 26.7 % — ABNORMAL LOW (ref 36.0–46.0)
Hemoglobin: 8.2 g/dL — ABNORMAL LOW (ref 12.0–15.0)
MCH: 23.4 pg — ABNORMAL LOW (ref 26.0–34.0)
MCHC: 30.7 g/dL (ref 30.0–36.0)
MCV: 76.3 fL — ABNORMAL LOW (ref 78.0–100.0)
PLATELETS: 227 10*3/uL (ref 150–400)
RBC: 3.5 MIL/uL — ABNORMAL LOW (ref 3.87–5.11)
RDW: 20.1 % — AB (ref 11.5–15.5)
WBC: 8.7 10*3/uL (ref 4.0–10.5)

## 2015-04-18 MED ORDER — IBUPROFEN 600 MG PO TABS: 600.0000 mg | ORAL_TABLET | Freq: Four times a day (QID) | ORAL | Status: DC

## 2015-04-18 MED ORDER — OXYCODONE-ACETAMINOPHEN 5-325 MG PO TABS: 1.0000 | ORAL_TABLET | ORAL | Status: DC | PRN

## 2015-04-18 NOTE — Discharge Summary (Signed)
Vaginal Delivery Discharge Summary  ALL information will be verified prior to discharge  Leslie Mayer  DOB:    1991/04/03 MRN:    [id number] CSN:    [id number]  Date of admission:                  04/16/15  Date of discharge:                   04/18/15  Procedures this admission: SVD  Date of Delivery: 04/17/15  Newborn Data:  Live born  Information for the patient's newborn:  Dalton, Ealy [[id number]]  female   Live born female  Birth Weight: 10 lb 12 oz (4876 g) APGAR: 8, 9  Home with mother.   History of Present Illness: Ms. Leslie Mayer is a 24 y.o. female, G2P2002, who presents at [redacted]w[redacted]d weeks gestation. The patient has been followed at the [facility] of [facility]. She was admitted onset of labor. Her pregnancy has been complicated by:  Patient Active Problem List   Diagnosis Date Noted  . Vaginal delivery 04/17/2015  . Positive GBS test 04/17/2015  . MRSA (methicillin resistant staph aureus) culture positive 04/17/2015  . Morbid obesity 04/17/2015  . 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 Asthma 11/23/2012    Hospital course: The patient was admitted for advance labor.   Her labor was not complicated. She proceeded to have a vaginal delivery of a healthy infant. Her delivery was not complicated. Her postpartum course was not complicated. She was discharged to home on postpartum day 1 doing well.  Feeding: breast and bottle  Contraception: unsure    Discharge hemoglobin: HEMOGLOBIN  Date Value Ref Range Status  04/18/2015 8.2* 12.0 - 15.0 g/dL Final  62/95/2841 32.4 g/dL Final   HCT  Date Value Ref Range Status  04/18/2015 26.7* 36.0 - 46.0 % Final  11/16/2012 38 % Final    PreNatal Labs ABO, Rh: --/--/O POS (07/26 1120)   Antibody: NEG (07/26 1120) Rubella:    immune RPR: Non Reactive (07/26 1120)  HBsAg: Negative (03/16 0000)  HIV: Non-reactive (03/16 0000)    GBS: Positive (03/16 0000)  Discharge Physical Exam:  General: alert and cooperative Lochia: appropriate Uterine Fundus: firm Incision: n/a DVT Evaluation: No evidence of DVT seen on physical exam.  Intrapartum Procedures: spontaneous vaginal delivery and GBS prophylaxis Postpartum Procedures: none Complications-Operative and Postpartum: none  Discharge Diagnoses: Term Pregnancy-delivered,  asymptomatic anemia  Activity:           pelvic rest Diet:                routine Medications: PNV, Ibuprofen, Iron and Percocet Condition:      stable     Postpartum Teaching: Nutrition, exercise, return to work or school, family visits, sexual activity, home rest, vaginal bleeding, pelvic rest, family planning, s/s of PPD, breast care peri-care and incision care   Discharge to: home     Brinae Woods, CNM, MSN 04/18/2015. 10:07 AM     Postpartum Care After Vaginal Delivery  After you deliver your newborn (postpartum period), the usual stay in the hospital is 24 72 hours. If there were problems with your labor or delivery, or if you have other medical problems, you might be in the hospital longer.  While you are in the hospital, you will receive help and instructions on how to care for yourself and your newborn during the postpartum period.  While you are in  the hospital:  Be sure to tell your nurses if you have pain or discomfort, as well as where you feel the pain and what makes the pain worse.  If you had an incision made near your vagina (episiotomy) or if you had some tearing during delivery, the nurses may put ice packs on your episiotomy or tear. The ice packs may help to reduce the pain and swelling.  If you are breastfeeding, you may feel uncomfortable contractions of your uterus for a couple of weeks. This is normal. The contractions help your uterus get back to normal size.  It is normal to have some bleeding after delivery.  For the first 1 3 days after delivery,  the flow is red and the amount may be similar to a period.  It is common for the flow to start and stop.  In the first few days, you may pass some small clots. Let your nurses know if you begin to pass large clots or your flow increases.  Do not  flush blood clots down the toilet before having the nurse look at them.  During the next 3 10 days after delivery, your flow should become more watery and pink or brown-tinged in color.  Ten to fourteen days after delivery, your flow should be a small amount of yellowish-white discharge.  The amount of your flow will decrease over the first few weeks after delivery. Your flow may stop in 6 8 weeks. Most women have had their flow stop by 12 weeks after delivery.  You should change your sanitary pads frequently.  Wash your hands thoroughly with soap and water for at least 20 seconds after changing pads, using the toilet, or before holding or feeding your newborn.  You should feel like you need to empty your bladder within the first 6 8 hours after delivery.  In case you become weak, lightheaded, or faint, call your nurse before you get out of bed for the first time and before you take a shower for the first time.  Within the first few days after delivery, your breasts may begin to feel tender and full. This is called engorgement. Breast tenderness usually goes away within 48 72 hours after engorgement occurs. You may also notice milk leaking from your breasts. If you are not breastfeeding, do not stimulate your breasts. Breast stimulation can make your breasts produce more milk.  Spending as much time as possible with your newborn is very important. During this time, you and your newborn can feel close and get to know each other. Having your newborn stay in your room (rooming in) will help to strengthen the bond with your newborn. It will give you time to get to know your newborn and become comfortable caring for your newborn.  Your hormones change  after delivery. Sometimes the hormone changes can temporarily cause you to feel sad or tearful. These feelings should not last more than a few days. If these feelings last longer than that, you should talk to your caregiver.  If desired, talk to your caregiver about methods of family planning or contraception.  Talk to your caregiver about immunizations. Your caregiver may want you to have the following immunizations before leaving the hospital:  Tetanus, diphtheria, and pertussis (Tdap) or tetanus and diphtheria (Td) immunization. It is very important that you and your family (including grandparents) or others caring for your newborn are up-to-date with the Tdap or Td immunizations. The Tdap or Td immunization can help protect your newborn from  getting ill.  Rubella immunization.  Varicella (chickenpox) immunization.  Influenza immunization. You should receive this annual immunization if you did not receive the immunization during your pregnancy. Document Released: 07/05/2007 Document Revised: 06/01/2012 Document Reviewed: 05/04/2012 [facility] Patient Information 2014 [place], [place].   Postpartum Depression and Baby Blues  The postpartum period begins right after the birth of a baby. During this time, there is often a great amount of joy and excitement. It is also a time of considerable changes in the life of the parent(s). Regardless of how many times a mother gives birth, each child brings new challenges and dynamics to the family. It is not unusual to have feelings of excitement accompanied by confusing shifts in moods, emotions, and thoughts. All mothers are at risk of developing postpartum depression or the "baby blues." These mood changes can occur right after giving birth, or they may occur many months after giving birth. The baby blues or postpartum depression can be mild or severe. Additionally, postpartum depression can resolve rather quickly, or it can be a long-term  condition. CAUSES Elevated hormones and their rapid decline are thought to be a main cause of postpartum depression and the baby blues. There are a number of hormones that radically change during and after pregnancy. Estrogen and progesterone usually decrease immediately after delivering your baby. The level of thyroid hormone and various cortisol steroids also rapidly drop. Other factors that play a major role in these changes include major life events and genetics.  RISK FACTORS If you have any of the following risks for the baby blues or postpartum depression, know what symptoms to watch out for during the postpartum period. Risk factors that may increase the likelihood of getting the baby blues or postpartum depression include: 1. Havinga personal or family history of depression. 2. Having depression while being pregnant. 3. Having premenstrual or oral contraceptive-associated mood issues. 4. Having exceptional life stress. 5. Having marital conflict. 6. Lacking a social support network. 7. Having a baby with special needs. 8. Having health problems such as diabetes. SYMPTOMS Baby blues symptoms include:  Brief fluctuations in mood, such as going from extreme happiness to sadness.  Decreased concentration.  Difficulty sleeping.  Crying spells, tearfulness.  Irritability.  Anxiety. Postpartum depression symptoms typically begin within the first month after giving birth. These symptoms include:  Difficulty sleeping or excessive sleepiness.  Marked weight loss.  Agitation.  Feelings of worthlessness.  Lack of interest in activity or food. Postpartum psychosis is a very concerning condition and can be dangerous. Fortunately, it is rare. Displaying any of the following symptoms is cause for immediate medical attention. Postpartum psychosis symptoms include:  Hallucinations and delusions.  Bizarre or disorganized behavior.  Confusion or disorientation. DIAGNOSIS  A  diagnosis is made by an evaluation of your symptoms. There are no medical or lab tests that lead to a diagnosis, but there are various questionnaires that a caregiver may use to identify those with the baby blues, postpartum depression, or psychosis. Often times, a screening tool called the [place] Postnatal Depression Scale is used to diagnose depression in the postpartum period.  TREATMENT The baby blues usually goes away on its own in 1 to 2 weeks. Social support is often all that is needed. You should be encouraged to get adequate sleep and rest. Occasionally, you may be given medicines to help you sleep.  Postpartum depression requires treatment as it can last several months or longer if it is not treated. Treatment may include individual or group  therapy, medicine, or both to address any social, physiological, and psychological factors that may play a role in the depression. Regular exercise, a healthy diet, rest, and social support may also be strongly recommended.  Postpartum psychosis is more serious and needs treatment right away. Hospitalization is often needed. HOME CARE INSTRUCTIONS  Get as much rest as you can. Nap when the baby sleeps.  Exercise regularly. Some women find yoga and walking to be beneficial.  Eat a balanced and nourishing diet.  Do little things that you enjoy. Have a cup of tea, take a bubble bath, read your favorite magazine, or listen to your favorite music.  Avoid alcohol.  Ask for help with household chores, cooking, grocery shopping, or running errands as needed. Do not try to do everything.  Talk to people close to you about how you are feeling. Get support from your partner, family members, friends, or other new moms.  Try to stay positive in how you think. Think about the things you are grateful for.  Do not spend a lot of time alone.  Only take medicine as directed by your caregiver.  Keep all your postpartum appointments.  Let your caregiver  know if you have any concerns. SEEK MEDICAL CARE IF: You are having a reaction or problems with your medicine. SEEK IMMEDIATE MEDICAL CARE IF:  You have suicidal feelings.  You feel you may harm the baby or someone else. Document Released: 06/11/2004 Document Revised: 11/30/2011 Document Reviewed: 07/14/2011 [facility] Patient Information 2014 [place], [place].     Breastfeeding Deciding to breastfeed is one of the best choices you can make for you and your baby. A change in hormones during pregnancy causes your breast tissue to grow and increases the number and size of your milk ducts. These hormones also allow proteins, sugars, and fats from your blood supply to make breast milk in your milk-producing glands. Hormones prevent breast milk from being released before your baby is born as well as prompt milk flow after birth. Once breastfeeding has begun, thoughts of your baby, as well as his or her sucking or crying, can stimulate the release of milk from your milk-producing glands.  BENEFITS OF BREASTFEEDING For Your Baby  Your first milk (colostrum) helps your baby's digestive system function better.   There are antibodies in your milk that help your baby fight off infections.   Your baby has a lower incidence of asthma, allergies, and sudden infant death syndrome.   The nutrients in breast milk are better for your baby than infant formulas and are designed uniquely for your baby's needs.   Breast milk improves your baby's brain development.   Your baby is less likely to develop other conditions, such as childhood obesity, asthma, or type 2 diabetes mellitus.  For You   Breastfeeding helps to create a very special bond between you and your baby.   Breastfeeding is convenient. Breast milk is always available at the correct temperature and costs nothing.   Breastfeeding helps to burn calories and helps you lose the weight gained during pregnancy.   Breastfeeding makes your  uterus contract to its prepregnancy size faster and slows bleeding (lochia) after you give birth.   Breastfeeding helps to lower your risk of developing type 2 diabetes mellitus, osteoporosis, and breast or ovarian cancer later in life. SIGNS THAT YOUR BABY IS HUNGRY Early Signs of Hunger  Increased alertness or activity.  Stretching.  Movement of the head from side to side.  Movement of the head  and opening of the mouth when the corner of the mouth or cheek is stroked (rooting).  Increased sucking sounds, smacking lips, cooing, sighing, or squeaking.  Hand-to-mouth movements.  Increased sucking of fingers or hands. Late Signs of Hunger  Fussing.  Intermittent crying. Extreme Signs of Hunger Signs of extreme hunger will require calming and consoling before your baby will be able to breastfeed successfully. Do not wait for the following signs of extreme hunger to occur before you initiate breastfeeding:   Restlessness.  A loud, strong cry.   Screaming.   BREASTFEEDING BASICS Breastfeeding Initiation  Find a comfortable place to sit or lie down, with your neck and back well supported.  Place a pillow or rolled up blanket under your baby to bring him or her to the level of your breast (if you are seated). Nursing pillows are specially designed to help support your arms and your baby while you breastfeed.  Make sure that your baby's abdomen is facing your abdomen.   Gently massage your breast. With your fingertips, massage from your chest wall toward your nipple in a circular motion. This encourages milk flow. You may need to continue this action during the feeding if your milk flows slowly.  Support your breast with 4 fingers underneath and your thumb above your nipple. Make sure your fingers are well away from your nipple and your baby's mouth.   Stroke your baby's lips gently with your finger or nipple.   When your baby's mouth is open wide enough, quickly bring  your baby to your breast, placing your entire nipple and as much of the colored area around your nipple (areola) as possible into your baby's mouth.   More areola should be visible above your baby's upper lip than below the lower lip.   Your baby's tongue should be between his or her lower gum and your breast.   Ensure that your baby's mouth is correctly positioned around your nipple (latched). Your baby's lips should create a seal on your breast and be turned out (everted).  It is common for your baby to suck about 2-3 minutes in order to start the flow of breast milk. Latching Teaching your baby how to latch on to your breast properly is very important. An improper latch can cause nipple pain and decreased milk supply for you and poor weight gain in your baby. Also, if your baby is not latched onto your nipple properly, he or she may swallow some air during feeding. This can make your baby fussy. Burping your baby when you switch breasts during the feeding can help to get rid of the air. However, teaching your baby to latch on properly is still the best way to prevent fussiness from swallowing air while breastfeeding. Signs that your baby has successfully latched on to your nipple:    Silent tugging or silent sucking, without causing you pain.   Swallowing heard between every 3-4 sucks.    Muscle movement above and in front of his or her ears while sucking.  Signs that your baby has not successfully latched on to nipple:   Sucking sounds or smacking sounds from your baby while breastfeeding.  Nipple pain. If you think your baby has not latched on correctly, slip your finger into the corner of your baby's mouth to break the suction and place it between your baby's gums. Attempt breastfeeding initiation again. Signs of Successful Breastfeeding Signs from your baby:   A gradual decrease in the number of sucks or  complete cessation of sucking.   Falling asleep.   Relaxation of  his or her body.   Retention of a small amount of milk in his or her mouth.   Letting go of your breast by himself or herself. Signs from you:  Breasts that have increased in firmness, weight, and size 1-3 hours after feeding.   Breasts that are softer immediately after breastfeeding.  Increased milk volume, as well as a change in milk consistency and color by the fifth day of breastfeeding.   Nipples that are not sore, cracked, or bleeding. Signs That Your Pecola Leisure is Getting Enough Milk  Wetting at least 3 diapers in a 24-hour period. The urine should be clear and pale yellow by age 33 days.  At least 3 stools in a 24-hour period by age 33 days. The stool should be soft and yellow.  At least 3 stools in a 24-hour period by age 39 days. The stool should be seedy and yellow.  No loss of weight greater than 10% of birth weight during the first 56 days of age.  Average weight gain of 4-7 ounces (113-198 g) per week after age 71 days.  Consistent daily weight gain by age 33 days, without weight loss after the age of 2 weeks. After a feeding, your baby may spit up a small amount. This is common. BREASTFEEDING FREQUENCY AND DURATION Frequent feeding will help you make more milk and can prevent sore nipples and breast engorgement. Breastfeed when you feel the need to reduce the fullness of your breasts or when your baby shows signs of hunger. This is called "breastfeeding on demand." Avoid introducing a pacifier to your baby while you are working to establish breastfeeding (the first 4-6 weeks after your baby is born). After this time you may choose to use a pacifier. Research has shown that pacifier use during the first year of a baby's life decreases the risk of sudden infant death syndrome (SIDS). Allow your baby to feed on each breast as long as he or she wants. Breastfeed until your baby is finished feeding. When your baby unlatches or falls asleep while feeding from the first breast, offer  the second breast. Because newborns are often sleepy in the first few weeks of life, you may need to awaken your baby to get him or her to feed. Breastfeeding times will vary from baby to baby. However, the following rules can serve as a guide to help you ensure that your baby is properly fed:  Newborns (babies 31 weeks of age or younger) may breastfeed every 1-3 hours.  Newborns should not go longer than 3 hours during the day or 5 hours during the night without breastfeeding.  You should breastfeed your baby a minimum of 8 times in a 24-hour period until you begin to introduce solid foods to your baby at around 10 months of age. BREAST MILK PUMPING Pumping and storing breast milk allows you to ensure that your baby is exclusively fed your breast milk, even at times when you are unable to breastfeed. This is especially important if you are going back to work while you are still breastfeeding or when you are not able to be present during feedings. Your lactation consultant can give you guidelines on how long it is safe to store breast milk.  A breast pump is a machine that allows you to pump milk from your breast into a sterile bottle. The pumped breast milk can then be stored in a refrigerator or  freezer. Some breast pumps are operated by hand, while others use electricity. Ask your lactation consultant which type will work best for you. Breast pumps can be purchased, but some hospitals and breastfeeding support groups lease breast pumps on a monthly basis. A lactation consultant can teach you how to hand express breast milk, if you prefer not to use a pump.  CARING FOR YOUR BREASTS WHILE YOU BREASTFEED Nipples can become dry, cracked, and sore while breastfeeding. The following recommendations can help keep your breasts moisturized and healthy:  Avoid using soap on your nipples.   Wear a supportive bra. Although not required, special nursing bras and tank tops are designed to allow access to your  breasts for breastfeeding without taking off your entire bra or top. Avoid wearing underwire-style bras or extremely tight bras.  Air dry your nipples for 3-15minutes after each feeding.   Use only cotton bra pads to absorb leaked breast milk. Leaking of breast milk between feedings is normal.   Use lanolin on your nipples after breastfeeding. Lanolin helps to maintain your skin's normal moisture barrier. If you use pure lanolin, you do not need to wash it off before feeding your baby again. Pure lanolin is not toxic to your baby. You may also hand express a few drops of breast milk and gently massage that milk into your nipples and allow the milk to air dry. In the first few weeks after giving birth, some women experience extremely full breasts (engorgement). Engorgement can make your breasts feel heavy, warm, and tender to the touch. Engorgement peaks within 3-5 days after you give birth. The following recommendations can help ease engorgement:  Completely empty your breasts while breastfeeding or pumping. You may want to start by applying warm, moist heat (in the shower or with warm water-soaked hand towels) just before feeding or pumping. This increases circulation and helps the milk flow. If your baby does not completely empty your breasts while breastfeeding, pump any extra milk after he or she is finished.  Wear a snug bra (nursing or regular) or tank top for 1-2 days to signal your body to slightly decrease milk production.  Apply ice packs to your breasts, unless this is too uncomfortable for you.  Make sure that your baby is latched on and positioned properly while breastfeeding. If engorgement persists after 48 hours of following these recommendations, contact your health care provider or a Advertising copywriter. OVERALL HEALTH CARE RECOMMENDATIONS WHILE BREASTFEEDING  Eat healthy foods. Alternate between meals and snacks, eating 3 of each per day. Because what you eat affects your  breast milk, some of the foods may make your baby more irritable than usual. Avoid eating these foods if you are sure that they are negatively affecting your baby.  Drink milk, fruit juice, and water to satisfy your thirst (about 10 glasses a day).   Rest often, relax, and continue to take your prenatal vitamins to prevent fatigue, stress, and anemia.  Continue breast self-awareness checks.  Avoid chewing and smoking tobacco.  Avoid alcohol and drug use. Some medicines that may be harmful to your baby can pass through breast milk. It is important to ask your health care provider before taking any medicine, including all over-the-counter and prescription medicine as well as vitamin and herbal supplements. It is possible to become pregnant while breastfeeding. If birth control is desired, ask your health care provider about options that will be safe for your baby. SEEK MEDICAL CARE IF:   You feel like you  want to stop breastfeeding or have become frustrated with breastfeeding.  You have painful breasts or nipples.  Your nipples are cracked or bleeding.  Your breasts are red, tender, or warm.  You have a swollen area on either breast.  You have a fever or chills.  You have nausea or vomiting.  You have drainage other than breast milk from your nipples.  Your breasts do not become full before feedings by the fifth day after you give birth.  You feel sad and depressed.  Your baby is too sleepy to eat well.  Your baby is having trouble sleeping.   Your baby is wetting less than 3 diapers in a 24-hour period.  Your baby has less than 3 stools in a 24-hour period.  Your baby's skin or the white part of his or her eyes becomes yellow.   Your baby is not gaining weight by 89 days of age. SEEK IMMEDIATE MEDICAL CARE IF:   Your baby is overly tired (lethargic) and does not want to wake up and feed.  Your baby develops an unexplained fever. Document Released: 09/07/2005  Document Revised: 09/12/2013 Document Reviewed: 03/01/2013 [facility] Patient Information 2015 [place], [place]. This information is not intended to replace advice given to you by your health care provider. Make sure you discuss any questions you have with your health care provider.

## 2015-04-19 ENCOUNTER — Inpatient Hospital Stay (HOSPITAL_COMMUNITY): Admission: RE | Admit: 2015-04-19 | Payer: Medicaid Other | Source: Ambulatory Visit

## 2015-04-22 DIAGNOSIS — L980 Pyogenic granuloma: Secondary | ICD-10-CM

## 2015-04-22 HISTORY — DX: Pyogenic granuloma: L98.0

## 2015-04-29 ENCOUNTER — Encounter (HOSPITAL_BASED_OUTPATIENT_CLINIC_OR_DEPARTMENT_OTHER): Payer: Self-pay | Admitting: *Deleted

## 2015-04-29 ENCOUNTER — Other Ambulatory Visit: Payer: Self-pay | Admitting: Orthopedic Surgery

## 2015-04-30 NOTE — Pre-Procedure Instructions (Signed)
Discussed post-partum Hgb. with Dr. Okey Dupre; no need for CBC pre-op.

## 2015-05-02 ENCOUNTER — Encounter (HOSPITAL_BASED_OUTPATIENT_CLINIC_OR_DEPARTMENT_OTHER)
Admission: RE | Disposition: A | Discharge status: Home / Self Care | Payer: Self-pay | Source: Ambulatory Visit | Attending: Orthopedic Surgery

## 2015-05-02 ENCOUNTER — Ambulatory Visit (HOSPITAL_BASED_OUTPATIENT_CLINIC_OR_DEPARTMENT_OTHER): Payer: Medicaid Other | Admitting: Anesthesiology

## 2015-05-02 ENCOUNTER — Encounter (HOSPITAL_BASED_OUTPATIENT_CLINIC_OR_DEPARTMENT_OTHER): Payer: Self-pay | Admitting: Anesthesiology

## 2015-05-02 ENCOUNTER — Ambulatory Visit (HOSPITAL_BASED_OUTPATIENT_CLINIC_OR_DEPARTMENT_OTHER)
Admission: RE | Admit: 2015-05-02 | Discharge: 2015-05-02 | Disposition: A | Discharge status: Home / Self Care | Payer: Medicaid Other | Source: Ambulatory Visit | Attending: Orthopedic Surgery | Admitting: Orthopedic Surgery

## 2015-05-02 DIAGNOSIS — Z880 Allergy status to penicillin: Secondary | ICD-10-CM | POA: Diagnosis not present

## 2015-05-02 DIAGNOSIS — Z6841 Body Mass Index (BMI) 40.0 and over, adult: Secondary | ICD-10-CM | POA: Insufficient documentation

## 2015-05-02 DIAGNOSIS — Z87891 Personal history of nicotine dependence: Secondary | ICD-10-CM | POA: Insufficient documentation

## 2015-05-02 DIAGNOSIS — K219 Gastro-esophageal reflux disease without esophagitis: Secondary | ICD-10-CM | POA: Diagnosis not present

## 2015-05-02 DIAGNOSIS — E669 Obesity, unspecified: Secondary | ICD-10-CM | POA: Insufficient documentation

## 2015-05-02 DIAGNOSIS — L980 Pyogenic granuloma: Secondary | ICD-10-CM | POA: Insufficient documentation

## 2015-05-02 DIAGNOSIS — J45909 Unspecified asthma, uncomplicated: Secondary | ICD-10-CM | POA: Diagnosis not present

## 2015-05-02 HISTORY — DX: Pyogenic granuloma: L98.0

## 2015-05-02 HISTORY — PX: MASS EXCISION: SHX2000

## 2015-05-02 HISTORY — DX: Gastro-esophageal reflux disease without esophagitis: K21.9

## 2015-05-02 LAB — POCT HEMOGLOBIN-HEMACUE: Hemoglobin: 11.8 g/dL — ABNORMAL LOW (ref 12.0–15.0)

## 2015-05-02 SURGERY — EXCISION MASS
Anesthesia: Regional | Site: Finger | Laterality: Left

## 2015-05-02 MED ORDER — HYDROCODONE-ACETAMINOPHEN 5-325 MG PO TABS
ORAL_TABLET | ORAL | Status: AC
  Filled 2015-05-02: qty 1

## 2015-05-02 MED ORDER — SCOPOLAMINE 1 MG/3DAYS TD PT72: 1.0000 | MEDICATED_PATCH | Freq: Once | TRANSDERMAL | Status: DC | PRN

## 2015-05-02 MED ORDER — LACTATED RINGERS IV SOLN
INTRAVENOUS | Status: DC
  Administered 2015-05-02: 11:00:00 via INTRAVENOUS

## 2015-05-02 MED ORDER — BUPIVACAINE HCL (PF) 0.25 % IJ SOLN
INTRAMUSCULAR | Status: DC | PRN
  Administered 2015-05-02: 10 mL

## 2015-05-02 MED ORDER — MIDAZOLAM HCL 2 MG/2ML IJ SOLN
INTRAMUSCULAR | Status: AC
Start: 2015-05-02 — End: 2015-05-02
  Filled 2015-05-02: qty 2

## 2015-05-02 MED ORDER — FENTANYL CITRATE (PF) 100 MCG/2ML IJ SOLN
50.0000 ug | INTRAMUSCULAR | Status: DC | PRN
  Administered 2015-05-02 (×2): 50 ug via INTRAVENOUS

## 2015-05-02 MED ORDER — CEFAZOLIN SODIUM-DEXTROSE 2-3 GM-% IV SOLR
INTRAVENOUS | Status: AC
  Filled 2015-05-02: qty 50

## 2015-05-02 MED ORDER — HYDROCODONE-ACETAMINOPHEN 5-325 MG PO TABS
1.0000 | ORAL_TABLET | Freq: Once | ORAL | Status: AC
Start: 2015-05-02 — End: 2015-05-02
  Administered 2015-05-02: 1 via ORAL

## 2015-05-02 MED ORDER — HYDROCODONE-ACETAMINOPHEN 5-325 MG PO TABS: ORAL_TABLET | ORAL | Status: DC

## 2015-05-02 MED ORDER — GLYCOPYRROLATE 0.2 MG/ML IJ SOLN: 0.2000 mg | Freq: Once | INTRAMUSCULAR | Status: DC | PRN

## 2015-05-02 MED ORDER — CEFAZOLIN SODIUM 1-5 GM-% IV SOLN
INTRAVENOUS | Status: AC
  Filled 2015-05-02: qty 50

## 2015-05-02 MED ORDER — FENTANYL CITRATE (PF) 100 MCG/2ML IJ SOLN
INTRAMUSCULAR | Status: AC
Start: 2015-05-02 — End: 2015-05-02
  Filled 2015-05-02: qty 4

## 2015-05-02 MED ORDER — PROPOFOL 10 MG/ML IV BOLUS
INTRAVENOUS | Status: DC | PRN
  Administered 2015-05-02: 20 mg via INTRAVENOUS
  Administered 2015-05-02: 10 mg via INTRAVENOUS

## 2015-05-02 MED ORDER — LIDOCAINE HCL (CARDIAC) 20 MG/ML IV SOLN
INTRAVENOUS | Status: DC | PRN
  Administered 2015-05-02: 50 mg via INTRAVENOUS

## 2015-05-02 MED ORDER — ONDANSETRON HCL 4 MG/2ML IJ SOLN: 4.0000 mg | Freq: Once | INTRAMUSCULAR | Status: DC | PRN

## 2015-05-02 MED ORDER — MIDAZOLAM HCL 2 MG/2ML IJ SOLN: 1.0000 mg | INTRAMUSCULAR | Status: DC | PRN

## 2015-05-02 MED ORDER — CHLORHEXIDINE GLUCONATE 4 % EX LIQD
60.0000 mL | Freq: Once | CUTANEOUS | Status: DC
Start: 2015-05-02 — End: 2015-05-02

## 2015-05-02 SURGICAL SUPPLY — 58 items
APL SKNCLS STERI-STRIP NONHPOA (GAUZE/BANDAGES/DRESSINGS)
BANDAGE COBAN STERILE 2 (GAUZE/BANDAGES/DRESSINGS) IMPLANT
BANDAGE ELASTIC 3 VELCRO ST LF (GAUZE/BANDAGES/DRESSINGS) IMPLANT
BENZOIN TINCTURE PRP APPL 2/3 (GAUZE/BANDAGES/DRESSINGS) IMPLANT
BLADE MINI RND TIP GREEN BEAV (BLADE) IMPLANT
BLADE SURG 15 STRL LF DISP TIS (BLADE) ×2 IMPLANT
BLADE SURG 15 STRL SS (BLADE) ×6
BNDG CMPR 9X4 STRL LF SNTH (GAUZE/BANDAGES/DRESSINGS)
BNDG CMPR MD 5X2 ELC HKLP STRL (GAUZE/BANDAGES/DRESSINGS)
BNDG COHESIVE 1X5 TAN STRL LF (GAUZE/BANDAGES/DRESSINGS) ×3 IMPLANT
BNDG CONFORM 2 STRL LF (GAUZE/BANDAGES/DRESSINGS) ×3 IMPLANT
BNDG ELASTIC 2 VLCR STRL LF (GAUZE/BANDAGES/DRESSINGS) IMPLANT
BNDG ESMARK 4X9 LF (GAUZE/BANDAGES/DRESSINGS) IMPLANT
BNDG GAUZE 1X2.1 STRL (MISCELLANEOUS) IMPLANT
BNDG GAUZE ELAST 4 BULKY (GAUZE/BANDAGES/DRESSINGS) IMPLANT
BNDG PLASTER X FAST 3X3 WHT LF (CAST SUPPLIES) IMPLANT
BNDG PLSTR 9X3 FST ST WHT (CAST SUPPLIES)
CHLORAPREP W/TINT 26ML (MISCELLANEOUS) ×3 IMPLANT
CLOSURE WOUND 1/2 X4 (GAUZE/BANDAGES/DRESSINGS)
CORDS BIPOLAR (ELECTRODE) ×3 IMPLANT
COVER BACK TABLE 60X90IN (DRAPES) ×3 IMPLANT
COVER MAYO STAND STRL (DRAPES) ×3 IMPLANT
CUFF TOURNIQUET SINGLE 18IN (TOURNIQUET CUFF) ×3 IMPLANT
DRAPE EXTREMITY T 121X128X90 (DRAPE) ×3 IMPLANT
DRAPE SURG 17X23 STRL (DRAPES) ×3 IMPLANT
GAUZE SPONGE 4X4 12PLY STRL (GAUZE/BANDAGES/DRESSINGS) ×3 IMPLANT
GAUZE XEROFORM 1X8 LF (GAUZE/BANDAGES/DRESSINGS) ×3 IMPLANT
GLOVE BIO SURGEON STRL SZ 6.5 (GLOVE) ×2 IMPLANT
GLOVE BIO SURGEON STRL SZ7.5 (GLOVE) ×3 IMPLANT
GLOVE BIO SURGEONS STRL SZ 6.5 (GLOVE) ×1
GLOVE BIOGEL PI IND STRL 7.0 (GLOVE) ×1 IMPLANT
GLOVE BIOGEL PI IND STRL 8 (GLOVE) ×1 IMPLANT
GLOVE BIOGEL PI INDICATOR 7.0 (GLOVE) ×2
GLOVE BIOGEL PI INDICATOR 8 (GLOVE) ×2
GLOVE EXAM NITRILE EXT CUFF MD (GLOVE) ×3 IMPLANT
GOWN STRL REUS W/ TWL LRG LVL3 (GOWN DISPOSABLE) ×1 IMPLANT
GOWN STRL REUS W/TWL LRG LVL3 (GOWN DISPOSABLE) ×3
GOWN STRL REUS W/TWL XL LVL3 (GOWN DISPOSABLE) ×3 IMPLANT
NEEDLE HYPO 25X1 1.5 SAFETY (NEEDLE) ×3 IMPLANT
NS IRRIG 1000ML POUR BTL (IV SOLUTION) ×3 IMPLANT
PACK BASIN DAY SURGERY FS (CUSTOM PROCEDURE TRAY) ×3 IMPLANT
PAD CAST 3X4 CTTN HI CHSV (CAST SUPPLIES) IMPLANT
PAD CAST 4YDX4 CTTN HI CHSV (CAST SUPPLIES) IMPLANT
PADDING CAST ABS 4INX4YD NS (CAST SUPPLIES) ×2
PADDING CAST ABS COTTON 4X4 ST (CAST SUPPLIES) ×1 IMPLANT
PADDING CAST COTTON 3X4 STRL (CAST SUPPLIES)
PADDING CAST COTTON 4X4 STRL (CAST SUPPLIES)
STOCKINETTE 4X48 STRL (DRAPES) ×3 IMPLANT
STRIP CLOSURE SKIN 1/2X4 (GAUZE/BANDAGES/DRESSINGS) IMPLANT
SUT ETHILON 3 0 PS 1 (SUTURE) IMPLANT
SUT ETHILON 4 0 PS 2 18 (SUTURE) ×3 IMPLANT
SUT ETHILON 5 0 P 3 18 (SUTURE)
SUT NYLON ETHILON 5-0 P-3 1X18 (SUTURE) IMPLANT
SUT VIC AB 4-0 P2 18 (SUTURE) IMPLANT
SYR BULB 3OZ (MISCELLANEOUS) ×3 IMPLANT
SYR CONTROL 10ML LL (SYRINGE) ×3 IMPLANT
TOWEL OR 17X24 6PK STRL BLUE (TOWEL DISPOSABLE) ×3 IMPLANT
UNDERPAD 30X30 (UNDERPADS AND DIAPERS) ×3 IMPLANT

## 2015-05-02 NOTE — Anesthesia Postprocedure Evaluation (Signed)
Anesthesia Post Note  Patient: Leslie Mayer  Procedure(s) Performed: Procedure(s) (LRB): EXCISION MASS LEFT LONG FINGER (Left)  Anesthesia type: regional  Patient location: PACU  Post pain: Pain level controlled  Post assessment: Patient's Cardiovascular Status Stable  Last Vitals:  Filed Vitals:   05/02/15 1337  BP: 125/87  Pulse: 61  Temp: 37.1 C  Resp: 16    Post vital signs: Reviewed and stable  Level of consciousness: awake  Complications: No apparent anesthesia complications

## 2015-05-02 NOTE — Transfer of Care (Signed)
Immediate Anesthesia Transfer of Care Note  Patient: Leslie Mayer  Procedure(s) Performed: Procedure(s): EXCISION MASS LEFT LONG FINGER (Left)  Patient Location: PACU  Anesthesia Type:MAC and Bier block  Level of Consciousness: awake, alert  and oriented  Airway & Oxygen Therapy: Patient Spontanous Breathing and Patient connected to face mask oxygen  Post-op Assessment: Report given to RN and Post -op Vital signs reviewed and stable  Post vital signs: Reviewed and stable  Last Vitals:  Filed Vitals:   05/02/15 1051  BP: 126/83  Pulse: 77  Temp: 37.3 C  Resp: 20    Complications: No apparent anesthesia complications

## 2015-05-02 NOTE — Brief Op Note (Signed)
05/02/2015  12:06 PM  PATIENT:  Leslie Mayer  24 y.o. 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  POST-OPERATIVE DIAGNOSIS:  eft long finger pyogenic granuloma  D21.12  PROCEDURE:  Procedure(s): EXCISION MASS LEFT LONG FINGER (Left)  SURGEON:  Surgeon(s) and Role:    * Betha Loa, MD - Primary  PHYSICIAN ASSISTANT:   ASSISTANTS: none   ANESTHESIA:   Bier block with sedation  EBL:  Total I/O In: 500 [I.V.:500] Out: -   BLOOD ADMINISTERED:none  DRAINS: none   LOCAL MEDICATIONS USED:  MARCAINE     SPECIMEN:  Source of Specimen:  left long finger  DISPOSITION OF SPECIMEN:  PATHOLOGY  COUNTS:  YES  TOURNIQUET:   Total Tourniquet Time Documented: Forearm (Left) - 26 minutes Total: Forearm (Left) - 26 minutes   DICTATION: .Other Dictation: Dictation Number [id number]  PLAN OF CARE: Discharge to home after PACU  PATIENT DISPOSITION:  PACU - hemodynamically stable.

## 2015-05-02 NOTE — Discharge Instructions (Addendum)

## 2015-05-02 NOTE — Anesthesia Preprocedure Evaluation (Addendum)
Anesthesia Evaluation  Patient identified by MRN, date of birth, ID band Patient awake    Reviewed: Allergy & Precautions, NPO status , Patient's Chart, lab work & pertinent test results  Airway Mallampati: I  TM Distance: >3 FB Neck ROM: Full    Dental   Pulmonary former smoker,    Pulmonary exam normal        Cardiovascular Normal cardiovascular exam     Neuro/Psych    GI/Hepatic GERD  Medicated and Controlled,  Endo/Other    Renal/GU      Musculoskeletal   Abdominal   Peds  Hematology   Anesthesia Other Findings   Reproductive/Obstetrics                             Anesthesia Physical Anesthesia Plan  ASA: II  Anesthesia Plan: Bier Block   Post-op Pain Management:    Induction: Intravenous  Airway Management Planned: Simple Face Mask  Additional Equipment:   Intra-op Plan:   Post-operative Plan:   Informed Consent: I have reviewed the patients History and Physical, chart, labs and discussed the procedure including the risks, benefits and alternatives for the proposed anesthesia with the patient or authorized representative who has indicated his/her understanding and acceptance.     Plan Discussed with: CRNA and Surgeon  Anesthesia Plan Comments:         Anesthesia Quick Evaluation  

## 2015-05-02 NOTE — H&P (Signed)
  Leslie Mayer is an 24 y.o. female.   Chief Complaint: left long finger mass HPI: 25 yo rhd female with mass on left long finger x 2 months.  This has been bothersome to her and it bleeds.  Removed by ED 04/01/15 with recurrence.  She wishes to have it surgically removed.  Past Medical History  Diagnosis Date  . Obesity 11/23/2012  . Asthma     prn inhaler  . Pyogenic granuloma 04/2015    left long finger  . GERD (gastroesophageal reflux disease)     no current med.    Past Surgical History  Procedure Laterality Date  . No past surgeries      Family History  Problem Relation Age of Onset  . Hypertension Paternal Grandmother   . Heart disease Paternal Grandmother    Social History:  reports that she quit smoking about 7 months ago. She has never used smokeless tobacco. She reports that she does not drink alcohol or use illicit drugs.  Allergies:  Allergies  Allergen Reactions  . Keflex [Cephalexin] Other (See Comments)    SKIN PEELED FROM HANDS    No prescriptions prior to admission    No results found for this or any previous visit (from the past 48 hour(s)).  No results found.   A comprehensive review of systems was negative except for: Eyes: positive for contacts/glasses Respiratory: positive for asthma  Height  (1.626 m), weight 137.893 kg (304 lb), currently breastfeeding.  General appearance: alert, cooperative and appears stated age Head: Normocephalic, without obvious abnormality, atraumatic Neck: supple, symmetrical, trachea midline Resp: clear to auscultation bilaterally Cardio: regular rate and rhythm GI: non tender Extremities: intact sensation and capillary refill all digits.  +epl/fpl/io.  mass volar aspect proximal phalanx. Pulses: 2+ and symmetric Skin: Skin color, texture, turgor normal. No rashes or lesions Neurologic: Grossly normal Incision/Wound: none  Assessment/Plan Left long finger pyogenic granuloma.  Non operative and  operative treatment options were discussed with the patient and patient wishes to proceed with operative treatment. Risks, benefits, and alternatives of surgery were discussed and the patient agrees with the plan of care.   Furman Trentman R 05/02/2015, 10:01 AM

## 2015-05-02 NOTE — Op Note (Signed)
[id number] 

## 2015-05-02 NOTE — Anesthesia Procedure Notes (Signed)
Anesthesia Regional Block:  Bier block (IV Regional)  Pre-Anesthetic Checklist: ,, timeout performed, Correct Patient, Correct Site, Correct Laterality, Correct Procedure,, site marked, surgical consent,, at surgeon's request Needles:  Injection technique: Single-shot  Needle Type: Other      Needle Gauge: 20 and 20 G    Additional Needles: Bier block (IV Regional) Narrative:  Start time: 05/02/2015 11:39 AM End time: 05/02/2015 11:39 AM Injection made incrementally with aspirations every 35 mL.  Performed by: Personally   Additional Notes: Esmark wrap, torq inflated 290, neg pulse, slowly injected 0.5% pres free Lido, pt tol well. No neg symptoms.

## 2015-05-03 ENCOUNTER — Encounter (HOSPITAL_BASED_OUTPATIENT_CLINIC_OR_DEPARTMENT_OTHER): Payer: Self-pay | Admitting: Orthopedic Surgery

## 2015-05-03 NOTE — Op Note (Signed)
Leslie Mayer, Leslie Mayer           ACCOUNT NO.:  [id number]  MEDICAL RECORD NO.:  [id number]  LOCATION:                               FACILITY:  [facility]  PHYSICIAN:  Betha Loa, MD        DATE OF BIRTH:  02-17-1991  DATE OF PROCEDURE:  05/02/2015 DATE OF DISCHARGE:  05/02/2015                              OPERATIVE REPORT   PREOPERATIVE DIAGNOSIS:  Left long finger pyogenic granuloma.  POSTOPERATIVE DIAGNOSIS:  Left long finger pyogenic granuloma.  PROCEDURE:  Excision of pyogenic granuloma, left long finger approximately 1.5 cm.  SURGEON:  Betha Loa, MD  ASSISTANT:  None.  ANESTHESIA:  Bier block with sedation.  IV FLUIDS:  Per anesthesia flow sheet.  ESTIMATED BLOOD LOSS:  Minimal.  COMPLICATIONS:  None.  SPECIMENS:  Left long finger pyogenic granuloma to Pathology.  TOURNIQUET TIME:  26 minutes.  DISPOSITION:  Stable to PACU.  INDICATIONS:  Leslie Mayer is a 24 year old female who has had a mass on the left long finger.  This is bothersome to her.  It was removed by the emergency department approximately 1 month ago with recurrence.  She wishes to have it removed.  Risks, benefits and alternatives of the surgery were discussed including the risk of blood loss; infection; damage to nerves, vessels, tendons, ligaments, bone; failure of surgery; need for additional surgery; complications with wound healing; continued pain and recurrence of mass.  She voiced understanding of these risks and elected to proceed.  OPERATIVE COURSE:  After being identified preoperatively by myself, the patient and I agreed upon the procedure and site of procedure.  Surgical site was marked.  The risks, benefits, and alternatives of the surgery were reviewed and she wished to proceed.  Surgical consent had been signed.  Antibiotics were held due to her be in lactating.  She was transferred to the operating room and placed on the operating room table in supine position with the left  upper extremity on an armboard.  Bier block anesthesia was induced by Anesthesiology.  Left upper extremity was prepped and draped in normal sterile orthopedic fashion.  A surgical pause was performed between the surgeons, anesthesia, and operating room staff, and all were in agreement as to the patient, procedure, and site of procedure.  Tourniquet at the proximal aspect of the forearm had been inflated for the Bier block.  An ellipse was made around the base of the pyogenic granuloma and carried into the subcutaneous tissues by spreading technique.  Bipolar electrocautery was used to obtain hemostasis.  Any vasculature going into the mass was treated with bipolar.  The mass was removed in its entirety and sent to Pathology for examination.  There was no remaining mass noted.  The wound was copiously irrigated with sterile saline.  It was closed with 4-0 nylon in a horizontal mattress fashion.  A digital block was performed with 10 mL of 0.25% plain Marcaine to aid in postoperative analgesia.  The wound was dressed with sterile Xeroform, 4x4s, Conformant, wrapped lightly with a Coban dressing.  The tourniquet was deflated at 26 minutes. Fingertips were pink with brisk capillary refill after deflation of the tourniquet.  The operative drapes were broken down.  The patient was awakened from sedation safely.  She was transferred back to the stretcher and taken to the PACU in stable condition.  The mass was sent to Pathology for examination.  I will see her back in the office in 1 week for postoperative followup.  I wrote a prescription for Norco 5/325, 1-2 p.o. q.6 hours p.r.n. pain, dispensed #30.  She understands she should not use this while breastfeeding and any breast milk from when she is taking any pain medication should to be discarded.  She states she has formula to feed her child at this time.     Betha Loa, MD     KK/MEDQ  D:  05/02/2015  T:  05/03/2015  Job:  [id number]

## 2016-02-18 ENCOUNTER — Ambulatory Visit (HOSPITAL_COMMUNITY)
Admission: EM | Admit: 2016-02-18 | Discharge: 2016-02-18 | Disposition: A | Discharge status: Home / Self Care | Payer: Medicaid Other | Attending: Family Medicine | Admitting: Family Medicine

## 2016-02-18 ENCOUNTER — Encounter (HOSPITAL_COMMUNITY): Payer: Self-pay | Admitting: Emergency Medicine

## 2016-02-18 DIAGNOSIS — H6123 Impacted cerumen, bilateral: Secondary | ICD-10-CM | POA: Diagnosis not present

## 2016-02-18 MED ORDER — CARBAMIDE PEROXIDE 6.5 % OT SOLN: OTIC | Status: DC

## 2016-02-18 NOTE — ED Notes (Signed)
PT discharged by Frank Patrick, PA 

## 2016-02-18 NOTE — Discharge Instructions (Signed)
Cerumen Impaction °The structures of the external ear canal secrete a waxy substance known as cerumen. Excess cerumen can build up in the ear canal, causing a condition known as cerumen impaction. Cerumen impaction can cause ear pain and disrupt the function of the ear. °The rate of cerumen production differs for each individual. In certain individuals, the configuration of the ear canal may decrease his or her ability to naturally remove cerumen. °CAUSES °Cerumen impaction is caused by excessive cerumen production or buildup. °RISK FACTORS °· Frequent use of swabs to clean ears. °· Having narrow ear canals. °· Having eczema. °· Being dehydrated. °SIGNS AND SYMPTOMS °· Diminished hearing. °· Ear drainage. °· Ear pain. °· Ear itch. °TREATMENT °Treatment may involve: °· Over-the-counter or prescription ear drops to soften the cerumen. °· Removal of cerumen by a health care provider. This may be done with: °· Irrigation with warm water. This is the most common method of removal. °· Ear curettes and other instruments. °· Surgery. This may be done in severe cases. °HOME CARE INSTRUCTIONS °· Take medicines only as directed by your health care provider. °· Do not insert objects into the ear with the intent of cleaning the ear. °PREVENTION °· Do not insert objects into the ear, even with the intent of cleaning the ear. Removing cerumen as a part of normal hygiene is not necessary, and the use of swabs in the ear canal is not recommended. °· Drink enough water to keep your urine clear or pale yellow. °· Control your eczema if you have it. °SEEK MEDICAL CARE IF: °· You develop ear pain. °· You develop bleeding from the ear. °· The cerumen does not clear after you use ear drops as directed. °  °This information is not intended to replace advice given to you by your health care provider. Make sure you discuss any questions you have with your health care provider. °  °Document Released: 10/15/2004 Document Revised: 09/28/2014  Document Reviewed: 04/24/2015 °Elsevier Interactive Patient Education ©2016 [employer] ° °Ear Drops, Adult °You need to put eardrops in your ear. °HOME CARE  °· Put drops in your affected ear as told. °· After putting in the drops, lie down with the ear you put the drops in facing up. Stay this way for 10 minutes. Use the ear drops as long as your doctor tells you. °· Before you get up, put a cotton ball gently in your ear. Do not push it far in your ear. °· Do not wash out your ears unless your doctor says it is okay. °· Finish all medicines as told by your doctor. You may be told to keep using the eardrops even if you start to feel better. °· See your doctor as told for follow-up visits. °GET HELP IF: °· You have pain that gets worse. °· Any unusual fluid (drainage) is coming from your ear (especially if the fluid stinks). °· You have trouble hearing. °· You get really dizzy as if the room is spinning and feel sick to your stomach (vertigo). °· The outside of your ear becomes red or puffy or both. This may be a sign of an allergic reaction. °MAKE SURE YOU:  °· Understand these instructions. °· Will watch your condition. °· Will get help right away if you are not doing well or get worse. °  °This information is not intended to replace advice given to you by your health care provider. Make sure you discuss any questions you have with your health care provider. °  °  Document Released: 02/25/2010 Document Revised: 09/28/2014 Document Reviewed: 04/04/2013 °Elsevier Interactive Patient Education ©2016 [employer] ° °

## 2016-02-18 NOTE — ED Notes (Signed)
PT reports history of cerumen impaction and last had her ears flushed 8 years ago. PT reports her hearing is muffled and she has a ringing sensation in her ears.

## 2016-02-18 NOTE — ED Provider Notes (Signed)
CSN: [id number]     Arrival date & time 02/18/16  1300 History   First MD Initiated Contact with Patient 02/18/16 1335     Chief Complaint  Patient presents with  . Cerumen Impaction   (Consider location/radiation/quality/duration/timing/severity/associated sxs/prior Treatment) HPI History obtained from patient:  Pt presents with the cc of:  Can't hear from the ears Duration of symptoms: Several days Treatment prior to arrival: None Context: Sudden unlikely has buildup of wax in both ears. Usually requires irrigation to improve symptoms. Other symptoms include: None Pain score:  FAMILY HISTORY: Hypertension-grandmother    Past Medical History  Diagnosis Date  . Obesity 11/23/2012  . Asthma     prn inhaler  . Pyogenic granuloma 04/2015    left long finger  . GERD (gastroesophageal reflux disease)     no current med.   Past Surgical History  Procedure Laterality Date  . No past surgeries    . Mass excision Left 05/02/2015    Procedure: EXCISION MASS LEFT LONG FINGER;  Surgeon: Betha Loa, MD;  Location: [facility];  Service: Orthopedics;  Laterality: Left;  . Hand tumor excision     Family History  Problem Relation Age of Onset  . Hypertension Paternal Grandmother   . Heart disease Paternal Grandmother    Social History  Substance Use Topics  . Smoking status: Former Smoker -- 0.00 packs/day    Quit date: 09/20/2014  . Smokeless tobacco: Never Used  . Alcohol Use: No   OB History    Gravida Para Term Preterm AB TAB SAB Ectopic Multiple Living   0 0 0 0 0 0 2     Review of Systems  Denies: HEADACHE, NAUSEA, ABDOMINAL PAIN, CHEST PAIN, CONGESTION, DYSURIA, SHORTNESS OF BREATH  Allergies  Keflex  Home Medications   Prior to Admission medications   Medication Sig Start Date End Date Taking? Authorizing Provider  albuterol (PROVENTIL HFA;VENTOLIN HFA) 108 (90 BASE) MCG/ACT inhaler Inhale 2 puffs into the lungs every 6 (six) hours as needed  for wheezing or shortness of breath.     Historical Provider, MD  carbamide peroxide (DEBROX) 6.5 % otic solution Use drops every 2 weeks to help with wax build up 02/18/16   Tharon Aquas, PA  HYDROcodone-acetaminophen [facility]) 5-325 MG per tablet 1-2 tabs po q6 hours prn pain 05/02/15   Betha Loa, MD  HYDROcodone-acetaminophen (NORCO/VICODIN) 5-325 MG per tablet Take 1 tablet by mouth every 6 (six) hours as needed for moderate pain.    Historical Provider, MD  Prenatal Vit-Fe Fumarate-FA (PRENATAL COMPLETE) 14-0.4 MG TABS Take 1 tablet by mouth daily. 10/04/14   Joni Reining Pisciotta, PA-C   Meds Ordered and Administered this Visit  Medications - No data to display  BP 116/77 mmHg  Pulse 71  Temp(Src) 98.5 F (36.9 C) (Oral)  Resp 16  SpO2 100% No data found.   Physical Exam NURSES NOTES AND VITAL SIGNS REVIEWED. CONSTITUTIONAL: Well developed, well nourished, no acute distress HEENT: normocephalic, atraumatic, both ears are occluded from visualization of the tympanic membrane is due to wax buildup. Post irrigation TMs look clear no signs of infection. EYES: Conjunctiva normal NECK:normal ROM, supple, no adenopathy PULMONARY:No respiratory distress, normal effort ABDOMINAL: Soft, ND, NT BS+, No CVAT MUSCULOSKELETAL: Normal ROM of all extremities,  SKIN: warm and dry without rash PSYCHIATRIC: Mood and affect, behavior are normal  ED Course  Procedures (including critical care time)  Labs Review Labs Reviewed - No data to  display  Imaging Review No results found.   Visual Acuity Review  Right Eye Distance:   Left Eye Distance:   Bilateral Distance:    Right Eye Near:   Left Eye Near:    Bilateral Near:     Both ears are irrigated by nursing staff with good improvement of symptoms. 2 large plugs of wax removed. Patient states that she can hear clearly now.  Debrox drops  MDM   1. Cerumen impaction, bilateral     Patient is reassured that there are no issues that  require transfer to higher level of care at this time or additional tests. Patient is advised to continue home symptomatic treatment. Patient is advised that if there are new or worsening symptoms to attend the emergency department, contact primary care provider, or return to UC. Instructions of care provided discharged home in stable condition.    THIS NOTE WAS GENERATED USING A VOICE RECOGNITION SOFTWARE PROGRAM. ALL REASONABLE EFFORTS  WERE MADE TO PROOFREAD THIS DOCUMENT FOR ACCURACY.  I have verbally reviewed the discharge instructions with the patient. A printed AVS was given to the patient.  All questions were answered prior to discharge.      Tharon AquasFrank C Pauline Trainer, PA 02/18/16 [phone]

## 2016-12-18 ENCOUNTER — Encounter (HOSPITAL_COMMUNITY): Payer: Self-pay | Admitting: Family Medicine

## 2016-12-18 ENCOUNTER — Ambulatory Visit (HOSPITAL_COMMUNITY)
Admission: EM | Admit: 2016-12-18 | Discharge: 2016-12-18 | Disposition: A | Discharge status: Home / Self Care | Payer: Medicaid Other | Attending: Internal Medicine | Admitting: Internal Medicine

## 2016-12-18 DIAGNOSIS — S00211A Abrasion of right eyelid and periocular area, initial encounter: Secondary | ICD-10-CM | POA: Diagnosis not present

## 2016-12-18 DIAGNOSIS — H18891 Other specified disorders of cornea, right eye: Secondary | ICD-10-CM

## 2016-12-18 MED ORDER — ERYTHROMYCIN 5 MG/GM OP OINT: TOPICAL_OINTMENT | OPHTHALMIC | 0 refills | Status: DC

## 2016-12-18 MED ORDER — BACITRACIN ZINC 500 UNIT/GM EX OINT: TOPICAL_OINTMENT | Freq: Every day | CUTANEOUS | Status: DC
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
## 2017-06-01 ENCOUNTER — Encounter (HOSPITAL_COMMUNITY): Payer: Self-pay | Admitting: *Deleted

## 2017-06-01 ENCOUNTER — Emergency Department (HOSPITAL_COMMUNITY)
Admission: EM | Admit: 2017-06-01 | Discharge: 2017-06-01 | Disposition: A | Discharge status: Home / Self Care | Payer: Medicaid Other | Attending: Emergency Medicine | Admitting: Emergency Medicine

## 2017-06-01 ENCOUNTER — Emergency Department (HOSPITAL_COMMUNITY): Payer: Medicaid Other

## 2017-06-01 DIAGNOSIS — Z87891 Personal history of nicotine dependence: Secondary | ICD-10-CM | POA: Diagnosis not present

## 2017-06-01 DIAGNOSIS — J45909 Unspecified asthma, uncomplicated: Secondary | ICD-10-CM | POA: Insufficient documentation

## 2017-06-01 DIAGNOSIS — R079 Chest pain, unspecified: Secondary | ICD-10-CM | POA: Diagnosis present

## 2017-06-01 DIAGNOSIS — J029 Acute pharyngitis, unspecified: Secondary | ICD-10-CM | POA: Insufficient documentation

## 2017-06-01 DIAGNOSIS — R0789 Other chest pain: Secondary | ICD-10-CM | POA: Insufficient documentation

## 2017-06-01 LAB — BASIC METABOLIC PANEL
ANION GAP: 10 (ref 5–15)
BUN: 6 mg/dL (ref 6–20)
CALCIUM: 9.1 mg/dL (ref 8.9–10.3)
CO2: 22 mmol/L (ref 22–32)
Chloride: 105 mmol/L (ref 101–111)
Creatinine, Ser: 0.83 mg/dL (ref 0.44–1.00)
GLUCOSE: 82 mg/dL (ref 65–99)
Potassium: 3.7 mmol/L (ref 3.5–5.1)
Sodium: 137 mmol/L (ref 135–145)

## 2017-06-01 LAB — CBC
HCT: 41.3 % (ref 36.0–46.0)
HEMOGLOBIN: 13.5 g/dL (ref 12.0–15.0)
MCH: 26.8 pg (ref 26.0–34.0)
MCHC: 32.7 g/dL (ref 30.0–36.0)
MCV: 82.1 fL (ref 78.0–100.0)
Platelets: 244 10*3/uL (ref 150–400)
RBC: 5.03 MIL/uL (ref 3.87–5.11)
RDW: 18.5 % — ABNORMAL HIGH (ref 11.5–15.5)
WBC: 6.3 10*3/uL (ref 4.0–10.5)

## 2017-06-01 LAB — I-STAT TROPONIN, ED: TROPONIN I, POC: 0 ng/mL (ref 0.00–0.08)

## 2017-06-01 LAB — RAPID STREP SCREEN (MED CTR MEBANE ONLY): Streptococcus, Group A Screen (Direct): NEGATIVE

## 2017-06-01 MED ORDER — METHOCARBAMOL 500 MG PO TABS: 500.0000 mg | ORAL_TABLET | Freq: Two times a day (BID) | ORAL | 0 refills | Status: DC

## 2017-06-01 MED ORDER — IBUPROFEN 800 MG PO TABS: 800.0000 mg | ORAL_TABLET | Freq: Three times a day (TID) | ORAL | 0 refills | Status: DC

## 2017-06-01 MED ORDER — ACETAMINOPHEN 325 MG PO TABS
650.0000 mg | ORAL_TABLET | Freq: Four times a day (QID) | ORAL | Status: DC | PRN
  Administered 2017-06-01: 650 mg via ORAL
  Filled 2017-06-01: qty 2

## 2017-06-01 NOTE — ED Triage Notes (Signed)
Pt is here with chest pain and then developed joint pain only on the right side.  Pt reports some sob with ambulation

## 2017-06-01 NOTE — ED Notes (Signed)
Pt taken out to lobby in wheelchair discharged with family. Pt retains belongings and prescriptions. Pt verbalizes understanding of d/c instructions.

## 2017-06-01 NOTE — ED Provider Notes (Signed)
[facility]-EMERGENCY DEPT Provider Note   CSN: [id number] Arrival date & time: 06/01/17  1422     History   Chief Complaint Chief Complaint  Patient presents with  . Chest Pain    HPI Leslie Mayer is a 26 y.o. female.  Pt complains of a sore throat and soreness in her chest.  Pt is concerned that she has strep.     The history is provided by the patient. No language interpreter was used.  Chest Pain   This is a new problem. The problem occurs constantly. The problem has been gradually worsening. The pain is associated with exertion. The pain is present in the lateral region. The pain does not radiate. She has tried nothing for the symptoms. The treatment provided no relief. There are no known risk factors.  Pertinent negatives for past medical history include no hypertension.  Pertinent negatives for family medical history include: no CAD.    Past Medical History:  Diagnosis Date  . Asthma    prn inhaler  . GERD (gastroesophageal reflux disease)    no current med.  . Obesity 11/23/2012  . Pyogenic granuloma 04/2015   left long finger    Patient Active Problem List   Diagnosis Date Noted  . Vaginal delivery 04/17/2015  . Positive GBS test 04/17/2015  . MRSA (methicillin resistant staph aureus) culture positive 04/17/2015  . Morbid obesity (HCC) 04/17/2015  . 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 Asthma 11/23/2012    Past Surgical History:  Procedure Laterality Date  . HAND TUMOR EXCISION    . MASS EXCISION Left 05/02/2015   Procedure: EXCISION MASS LEFT LONG FINGER;  Surgeon: Betha LoaKevin Kuzma, MD;  Location: [facility];  Service: Orthopedics;  Laterality: Left;  . NO PAST SURGERIES      OB History    Gravida Para Term Preterm AB Living   2 2 2  0 0 2   SAB TAB Ectopic Multiple Live Births   0 0 0 0 2       Home Medications    Prior to Admission medications   Medication Sig Start Date End Date Taking? Authorizing Provider    HYDROcodone-acetaminophen (NORCO) 5-325 MG per tablet 1-2 tabs po q6 hours prn pain 05/02/15  Yes Betha LoaKuzma, Kevin, MD  ibuprofen (ADVIL,MOTRIN) 200 MG tablet Take 200 mg by mouth every 6 (six) hours as needed for mild pain.   Yes [provider]  carbamide peroxide (DEBROX) 6.5 % otic solution Use drops every 2 weeks to help with wax build up Patient not taking: Reported on 06/01/2017 02/18/16   Tharon AquasPatrick, Frank C, PA  erythromycin ophthalmic ointment Apply thin layer to upper eyelid and eyelash 3 times daily for 5 days. Patient not taking: Reported on 06/01/2017 12/18/16   Lurene ShadowPhelps, Erin O, PA-C  Prenatal Vit-Fe Fumarate-FA (PRENATAL COMPLETE) 14-0.4 MG TABS Take 1 tablet by mouth daily. Patient not taking: Reported on 06/01/2017 10/04/14   Pisciotta, Joni ReiningNicole, PA-C    Family History Family History  Problem Relation Age of Onset  . Hypertension Paternal Grandmother   . Heart disease Paternal Grandmother     Social History Social History  Substance Use Topics  . Smoking status: Former Smoker    Packs/day: 0.00    Quit date: 09/20/2014  . Smokeless tobacco: Never Used  . Alcohol use No     Allergies   Keflex [cephalexin]   Review of Systems Review of Systems  Cardiovascular: Positive for chest pain.  All other systems reviewed and  are negative.    Physical Exam Updated Vital Signs BP 112/63   Pulse 75   Temp 98 F (36.7 C) (Oral)   Resp (!) 22   LMP 05/18/2017 (Approximate)   SpO2 100%   Physical Exam  Constitutional: She is oriented to person, place, and time. She appears well-developed and well-nourished.  HENT:  Head: Normocephalic.  Right Ear: External ear normal.  Left Ear: External ear normal.  Nose: Nose normal.  Injected pharynx.  Erythema   Eyes: Pupils are equal, round, and reactive to light.  Neck: Normal range of motion.  Cardiovascular: Normal rate and regular rhythm.   Pulmonary/Chest: Effort normal and breath sounds normal.  Abdominal: Soft.   Musculoskeletal: Normal range of motion.  Neurological: She is alert and oriented to person, place, and time.  Skin: Skin is warm.  Nursing note and vitals reviewed.    ED Treatments / Results  Labs (all labs ordered are listed, but only abnormal results are displayed) Labs Reviewed  CBC - Abnormal; Notable for the following:       Result Value   RDW 18.5 (*)    All other components within normal limits  RAPID STREP SCREEN (NOT AT [facility])  CULTURE, GROUP A STREP [facility])  BASIC METABOLIC PANEL  I-STAT TROPONIN, ED    EKG  EKG Interpretation None       Radiology Dg Chest 2 View  Result Date: 06/01/2017 CLINICAL DATA:  Acute onset of chest pain radiating into the right arm which began yesterday. Current smoker with current history of asthma. EXAM: CHEST  2 VIEW COMPARISON:  02/13/2014, 06/23/2012. FINDINGS: Suboptimal inspiration accounts for crowded bronchovascular markings, especially in the bases, and accentuates the cardiac silhouette. Taking this into account, cardiomediastinal silhouette unremarkable and unchanged. Lungs clear. Bronchovascular markings normal. Pulmonary vascularity normal. No visible pleural effusions. No pneumothorax. Visualized bony thorax intact. IMPRESSION: Suboptimal inspiration.  No acute cardiopulmonary disease. Electronically Signed   By: Hulan Saas M.D.   On: 06/01/2017 15:20    Procedures Procedures (including critical care time)  Medications Ordered in ED Medications  acetaminophen (TYLENOL) tablet 650 mg (650 mg Oral Given 06/01/17 2053)     Initial Impression / Assessment and Plan / ED Course  I have reviewed the triage vital signs and the nursing notes.  Pertinent labs & imaging results that were available during my care of the patient were reviewed by me and considered in my medical decision making (see chart for details).     Pt given tylenol for pain,  Probable viral pharyngitis and chest wall pain.  Pt advised to follow up  with her MD for recheck 3-4 days.   Final Clinical Impressions(s) / ED Diagnoses   Final diagnoses:  Chest wall pain  Pharyngitis, unspecified etiology    New Prescriptions New Prescriptions   IBUPROFEN (ADVIL,MOTRIN) 800 MG TABLET    Take 1 tablet (800 mg total) by mouth 3 (three) times daily.   An After Visit Summary was printed and given to the patient.    Elson Areas, PA-C 06/01/17 2210    Raeford Razor, MD 06/11/17 0630

## 2017-06-01 NOTE — ED Notes (Signed)
ED Provider at bedside. 

## 2017-06-04 LAB — CULTURE, GROUP A STREP (THRC)

## 2018-10-11 IMAGING — DX DG CHEST 2V
2 series · 2 of 2 positions shown · non-contrast
Comparison: 02/13/2014, 06/23/2012.

CLINICAL DATA: Acute onset of chest pain radiating into the right
arm which began yesterday. Current smoker with current history of
asthma.

EXAM:
CHEST  2 VIEW

[w chest pa]
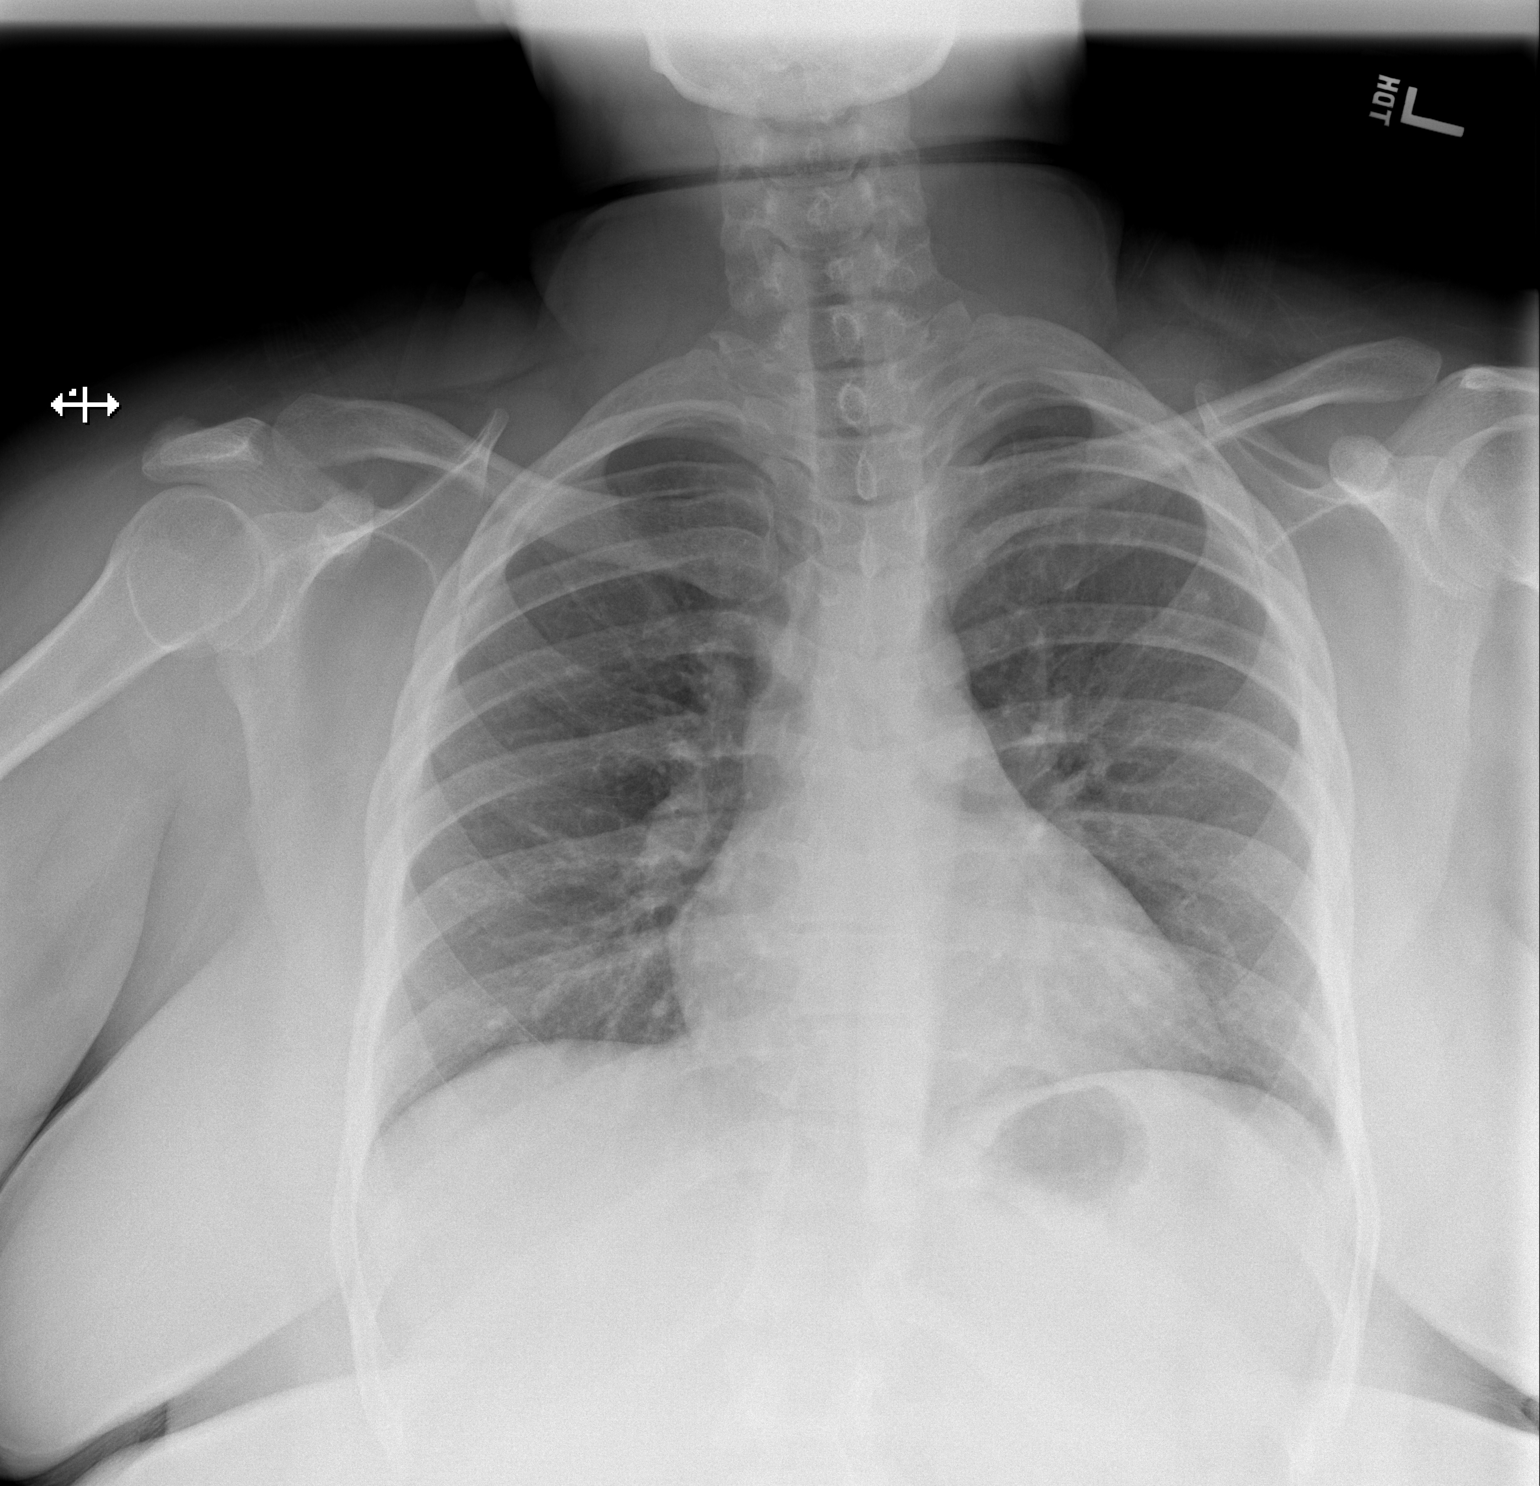

[w chest lat]
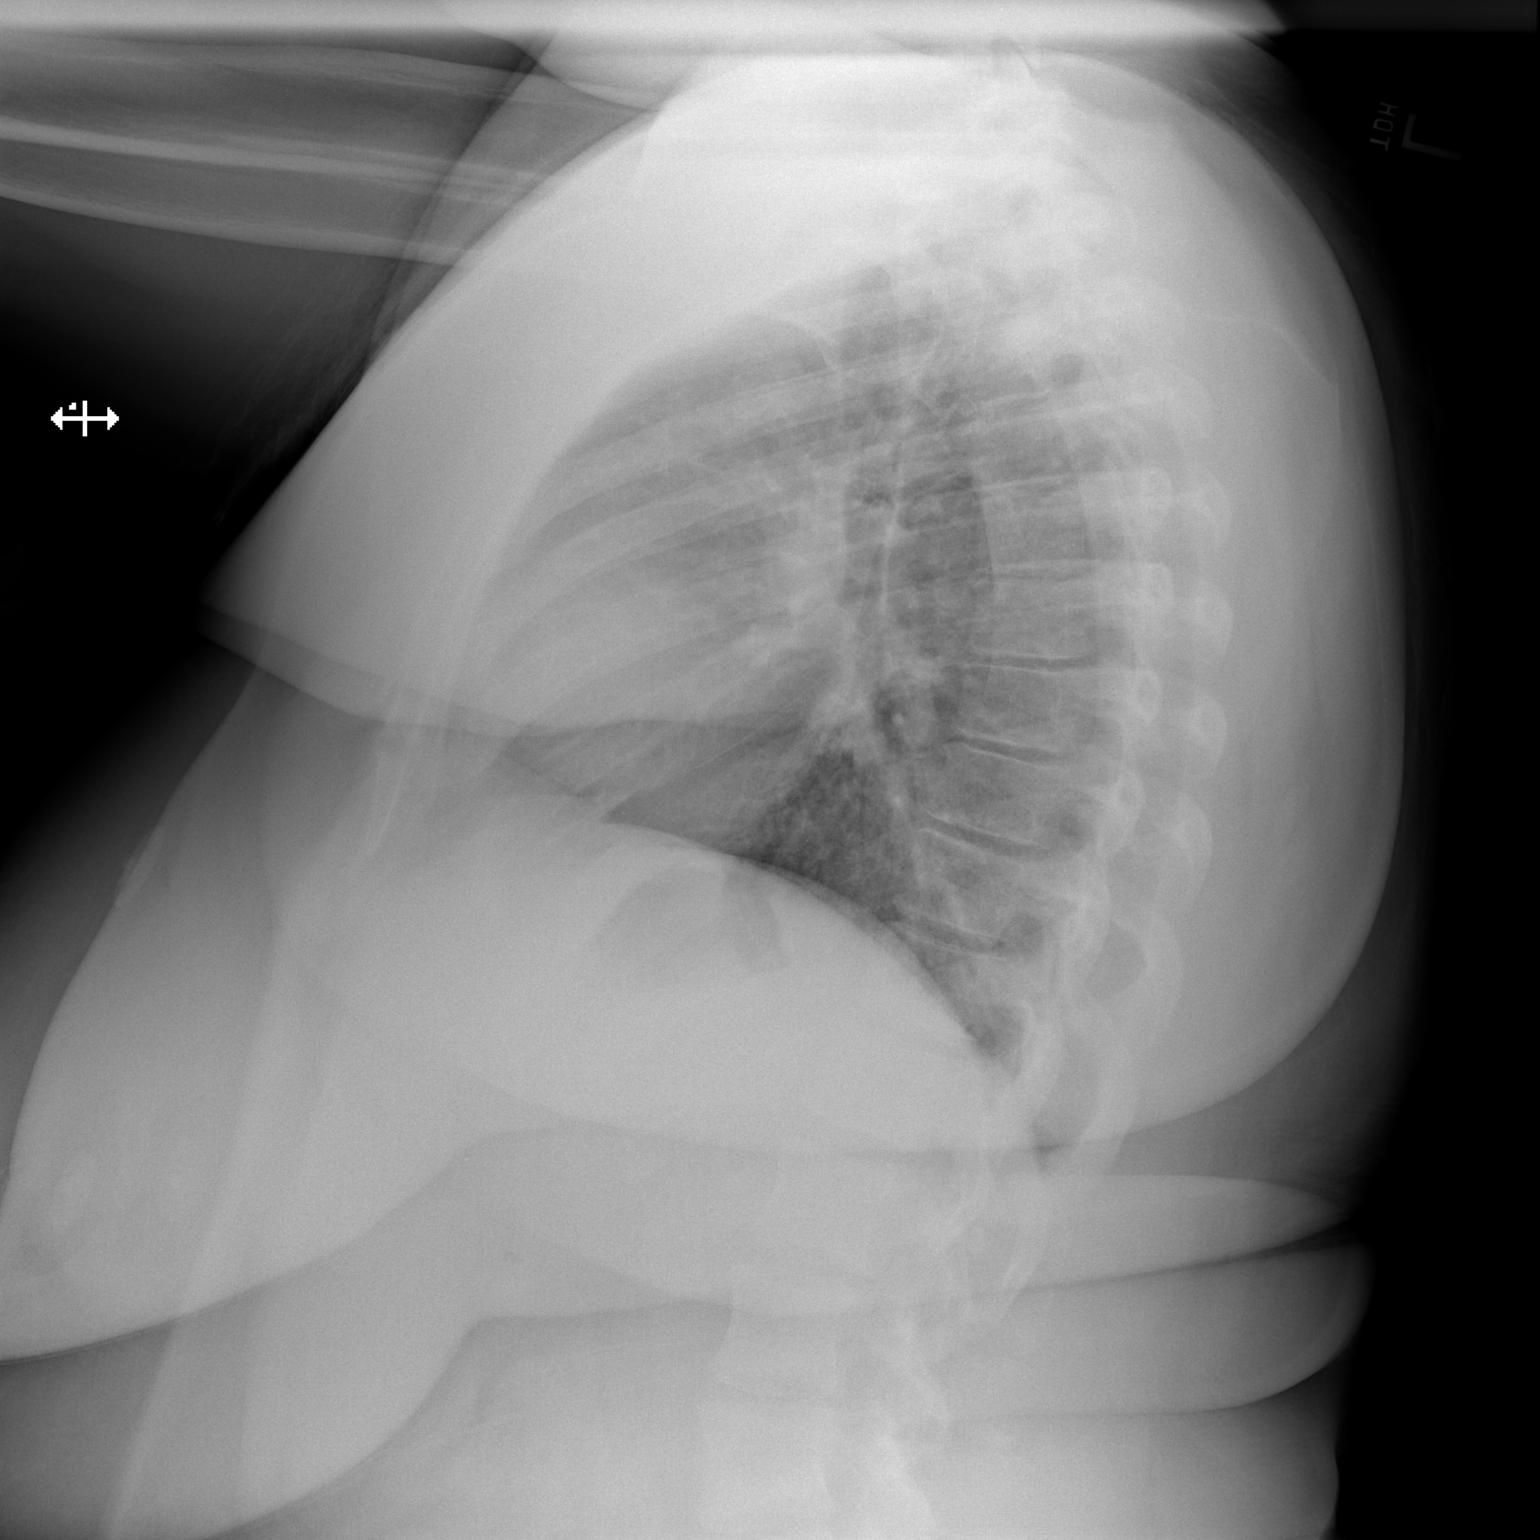

[2 of 2 positions shown; findings below may reference images not displayed]

FINDINGS: Suboptimal inspiration accounts for crowded bronchovascular
markings, especially in the bases, and accentuates the cardiac
silhouette. Taking this into account, cardiomediastinal silhouette
unremarkable and unchanged. Lungs clear. Bronchovascular markings
normal. Pulmonary vascularity normal. No visible pleural effusions.
No pneumothorax. Visualized bony thorax intact.
IMPRESSION: Suboptimal inspiration.  No acute cardiopulmonary disease.

## 2019-11-28 ENCOUNTER — Other Ambulatory Visit: Payer: Self-pay

## 2019-11-28 ENCOUNTER — Encounter (HOSPITAL_COMMUNITY): Payer: Self-pay

## 2019-11-28 ENCOUNTER — Ambulatory Visit (HOSPITAL_COMMUNITY)
Admission: EM | Admit: 2019-11-28 | Discharge: 2019-11-28 | Disposition: A | Discharge status: Home / Self Care | Payer: Medicaid Other | Attending: Family Medicine | Admitting: Family Medicine

## 2019-11-28 DIAGNOSIS — T24209A Burn of second degree of unspecified site of unspecified lower limb, except ankle and foot, initial encounter: Secondary | ICD-10-CM

## 2019-11-28 MED ORDER — IBUPROFEN 800 MG PO TABS: 800.0000 mg | ORAL_TABLET | Freq: Three times a day (TID) | ORAL | 0 refills | Status: DC

## 2019-11-28 MED ORDER — HYDROCODONE-ACETAMINOPHEN 5-325 MG PO TABS: 1.0000 | ORAL_TABLET | Freq: Four times a day (QID) | ORAL | 0 refills | Status: DC | PRN
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
## 2019-11-28 NOTE — Discharge Instructions (Signed)

## 2021-02-15 ENCOUNTER — Emergency Department (HOSPITAL_COMMUNITY)
Admission: EM | Admit: 2021-02-15 | Discharge: 2021-02-15 | Disposition: A | Discharge status: Home / Self Care | Payer: Self-pay | Attending: Emergency Medicine | Admitting: Emergency Medicine

## 2021-02-15 ENCOUNTER — Other Ambulatory Visit: Payer: Self-pay

## 2021-02-15 ENCOUNTER — Encounter (HOSPITAL_COMMUNITY): Payer: Self-pay | Admitting: Emergency Medicine

## 2021-02-15 DIAGNOSIS — J45909 Unspecified asthma, uncomplicated: Secondary | ICD-10-CM | POA: Insufficient documentation

## 2021-02-15 DIAGNOSIS — K0889 Other specified disorders of teeth and supporting structures: Secondary | ICD-10-CM | POA: Insufficient documentation

## 2021-02-15 DIAGNOSIS — Z87891 Personal history of nicotine dependence: Secondary | ICD-10-CM | POA: Insufficient documentation

## 2021-02-15 MED ORDER — OXYCODONE-ACETAMINOPHEN 5-325 MG PO TABS
1.0000 | ORAL_TABLET | Freq: Once | ORAL | Status: AC
  Administered 2021-02-15: 1 via ORAL
  Filled 2021-02-15: qty 1

## 2021-02-15 NOTE — ED Notes (Signed)
Patient requesting to leave AMA. Patient signed ama form.

## 2021-02-15 NOTE — ED Triage Notes (Signed)
Patient c/o left lower dental pain after cracking tooth chewing ice last night. States she does not have a Education officer, community.

## 2021-02-15 NOTE — Discharge Instructions (Addendum)
I recommend a combination of tylenol and ibuprofen for management of your pain. You can take a low dose of both at the same time. I recommend 500 mg of Tylenol combined with 600 mg of ibuprofen. This is one maximum strength Tylenol and three regular ibuprofen. You can take these 2-3 times for day for your pain. Please try to take these medications with a small amount of food as well to prevent upsetting your stomach.  Please continue to apply the topical lidocaine as well as Orajel to the tooth.  I would also recommend salt water gargles 2-3 times per day.  Below is the contact information for a dental provider that is on-call through our medical system.  Please give them a call and see if he can schedule a follow-up appointment.  Otherwise, I would recommend calling other dental providers in the area to see who can evaluate you the earliest.  If you develop new or worsening symptoms you can always return to the emergency department.  It was a pleasure to meet you.

## 2021-02-15 NOTE — ED Provider Notes (Signed)
[facility]-EMERGENCY DEPT Provider Note   CSN: [id number] Arrival date & time: 02/15/21  1200     History Chief Complaint  Patient presents with  . Dental Pain    Leslie Mayer is a 30 y.o. female.  HPI Patient is a 30 year old female with a medical history as noted below.  She presents to the emergency department due to dental pain.  Patient states last night she was chewing ice and broke a left lower molar.  She reports severe pain in the region.  Pain worsens when chewing and swallowing.  She has taken Tylenol, ibuprofen, hydrocodone, applied topical lidocaine, as well as applied Orajel with minimal relief.  No other complaints.   Past Medical History:  Diagnosis Date  . Asthma    prn inhaler  . GERD (gastroesophageal reflux disease)    no current med.  . Obesity 11/23/2012  . Pyogenic granuloma 04/2015   left long finger    Patient Active Problem List   Diagnosis Date Noted  . Vaginal delivery 04/17/2015  . Positive GBS test 04/17/2015  . MRSA (methicillin resistant staph aureus) culture positive 04/17/2015  . Morbid obesity (HCC) 04/17/2015  . 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 Asthma 11/23/2012    Past Surgical History:  Procedure Laterality Date  . HAND TUMOR EXCISION    . MASS EXCISION Left 05/02/2015   Procedure: EXCISION MASS LEFT LONG FINGER;  Surgeon: Betha Loa, MD;  Location: [facility];  Service: Orthopedics;  Laterality: Left;  . NO PAST SURGERIES       OB History    Gravida  2   Para  2   Term  2   Preterm  0   AB  0   Living  2     SAB  0   IAB  0   Ectopic  0   Multiple  0   Live Births  2           Family History  Problem Relation Age of Onset  . Diabetes Mother   . Sleep apnea Father   . Hypertension Paternal Grandmother   . Heart disease Paternal Grandmother     Social History   Tobacco Use  . Smoking status: Former Smoker    Packs/day: 0.00    Quit date:  09/20/2014    Years since quitting: 6.4  . Smokeless tobacco: Never Used  Substance Use Topics  . Alcohol use: No  . Drug use: No    Home Medications Prior to Admission medications   Medication Sig Start Date End Date Taking? Authorizing Provider  HYDROcodone-acetaminophen (NORCO/VICODIN) 5-325 MG tablet Take 1 tablet by mouth every 6 (six) hours as needed for moderate pain or severe pain. 11/28/19   Mardella Layman, MD  ibuprofen (ADVIL) 800 MG tablet Take 1 tablet (800 mg total) by mouth 3 (three) times daily with meals. 11/28/19   Mardella Layman, MD  methocarbamol (ROBAXIN) 500 MG tablet Take 1 tablet (500 mg total) by mouth 2 (two) times daily. 06/01/17   Elson Areas, PA-C    Allergies    Keflex [cephalexin]  Review of Systems   Review of Systems  Constitutional: Negative for chills and fever.  HENT: Positive for dental problem. Negative for facial swelling and sore throat.   Respiratory: Negative for shortness of breath.     Physical Exam Updated Vital Signs BP (!) 128/107 (BP Location: Right Arm)   Pulse 91   Temp 99.4 F (37.4 C) (  Oral)   Resp 18   LMP 02/08/2021 (Approximate)   SpO2 99%   Physical Exam Vitals and nursing note reviewed.  Constitutional:      General: She is not in acute distress.    Appearance: Normal appearance. She is well-developed.     Comments: Patient speaking clearly and coherently.  She is sitting upright and answering questions when asked.  Tearful during my exam.  HENT:     Head: Normocephalic and atraumatic.     Right Ear: External ear normal.     Left Ear: External ear normal.     Mouth/Throat:     Mouth: Mucous membranes are moist.     Pharynx: Oropharynx is clear. No oropharyngeal exudate or posterior oropharyngeal erythema.     Comments: Diffuse dental caries and extracted teeth noted.  Broken left lower second molar.  Exquisite pain with manipulation of the tooth.  No visible or palpable fluctuance or erythema noted in the region.   Uvula midline.  Readily handling secretions.  No hot potato voice. Eyes:     General: No scleral icterus.       Right eye: No discharge.        Left eye: No discharge.     Conjunctiva/sclera: Conjunctivae normal.  Neck:     Trachea: No tracheal deviation.  Cardiovascular:     Rate and Rhythm: Normal rate.     Pulses: Normal pulses.  Pulmonary:     Effort: Pulmonary effort is normal. No respiratory distress.     Breath sounds: No stridor.  Abdominal:     General: There is no distension.  Musculoskeletal:        General: No swelling or deformity.     Cervical back: Neck supple.  Skin:    General: Skin is warm and dry.     Findings: No rash.  Neurological:     Mental Status: She is alert.     Cranial Nerves: Cranial nerve deficit: no gross deficits.    ED Results / Procedures / Treatments   Labs (all labs ordered are listed, but only abnormal results are displayed) Labs Reviewed - No data to display  EKG None  Radiology No results found.  Procedures Procedures   Medications Ordered in ED Medications  oxyCODONE-acetaminophen (PERCOCET/ROXICET) 5-325 MG per tablet 1 tablet (1 tablet Oral Given 02/15/21 1257)    ED Course  I have reviewed the triage vital signs and the nursing notes.  Pertinent labs & imaging results that were available during my care of the patient were reviewed by me and considered in my medical decision making (see chart for details).    MDM Rules/Calculators/A&P                          Patient is a 30 year old female who presents to the emergency department with a broken left lower molar.  History of dental issues in the past.  States she broke her tooth chewing ice last night.  She was tearful during my exam and in exquisite pain so she was given 1 Percocet while in the emergency department.  She has a ride home.  Did not see any signs or symptoms of infection on my exam.  Uvula midline.  Speaking clearly and coherently.  Soft submental and  sublingual compartments.  Doubt Ludwig's angina or PTA at this time.  Recommended patient continue to take Tylenol/ibuprofen for pain.  We discussed dosing.  Also recommended Orajel as well as  continued use of her topical lidocaine.  She was given a referral to a dental provider.  Also recommended that she continue to call dental providers in the area.  She knows that if her symptoms worsen over the weekend that she needs to return to the emergency department for reevaluation.  Her questions were answered and she was amicable at the time of discharge.  Final Clinical Impression(s) / ED Diagnoses Final diagnoses:  Pain, dental   Rx / DC Orders ED Discharge Orders    None       Placido Sou, PA-C 02/15/21 1324    Pricilla Loveless, MD 02/15/21 [phone]

## 2021-09-03 ENCOUNTER — Encounter (HOSPITAL_COMMUNITY): Payer: Self-pay

## 2021-09-03 ENCOUNTER — Other Ambulatory Visit: Payer: Self-pay

## 2021-09-03 ENCOUNTER — Ambulatory Visit (HOSPITAL_COMMUNITY)
Admission: EM | Admit: 2021-09-03 | Discharge: 2021-09-03 | Disposition: A | Discharge status: Home / Self Care | Payer: Self-pay | Attending: Family Medicine | Admitting: Family Medicine

## 2021-09-03 DIAGNOSIS — L02212 Cutaneous abscess of back [any part, except buttock]: Secondary | ICD-10-CM

## 2021-09-03 DIAGNOSIS — L309 Dermatitis, unspecified: Secondary | ICD-10-CM

## 2021-09-03 MED ORDER — NAPROXEN 375 MG PO TABS: 375.0000 mg | ORAL_TABLET | Freq: Two times a day (BID) | ORAL | 0 refills | Status: DC

## 2021-09-03 MED ORDER — KETOROLAC TROMETHAMINE 60 MG/2ML IM SOLN
INTRAMUSCULAR | Status: AC
  Filled 2021-09-03: qty 2

## 2021-09-03 MED ORDER — KETOROLAC TROMETHAMINE 60 MG/2ML IM SOLN
60.0000 mg | Freq: Once | INTRAMUSCULAR | Status: AC
  Administered 2021-09-03: 20:00:00 60 mg via INTRAMUSCULAR

## 2021-09-03 MED ORDER — DOXYCYCLINE MONOHYDRATE 100 MG PO CAPS: 100.0000 mg | ORAL_CAPSULE | Freq: Two times a day (BID) | ORAL | 0 refills | Status: DC

## 2021-09-03 MED ORDER — TRIAMCINOLONE ACETONIDE 0.025 % EX OINT: 1.0000 "application " | TOPICAL_OINTMENT | Freq: Two times a day (BID) | CUTANEOUS | 0 refills | Status: DC

## 2021-09-03 NOTE — ED Triage Notes (Signed)
Pt presents with ongoing rash on back that has been unrelieved with prescribed and OTC medication X 3 months.

## 2021-09-03 NOTE — ED Provider Notes (Signed)
[facility]    CSN: [id number] Arrival date & time: 09/03/21  1900      History   Chief Complaint Chief Complaint  Patient presents with   Rash    HPI Leslie Mayer is a 30 y.o. female.   HPI Patient presents today with a painful abscess in the mid back along with a pruritic rash involving her back.  She reports that she has had abscesses develop on her back before however she is never required an I&D. She reports the current abscesses been there approximately 4 months and has increased in diameter and now she is having difficulty lying on her back due to the severity of the pain.  She is afebrile.  Has a tried topical applications which she purchased over-the-counter for treatment of the rash without relief  Past Medical History:  Diagnosis Date   Asthma    prn inhaler   GERD (gastroesophageal reflux disease)    no current med.   Obesity 11/23/2012   Pyogenic granuloma 04/2015   left long finger    Patient Active Problem List   Diagnosis Date Noted   Vaginal delivery 04/17/2015   Positive GBS test 04/17/2015   MRSA (methicillin resistant staph aureus) culture positive 04/17/2015   Morbid obesity (HCC) 04/17/2015   Allergy to cephalosporin--keflex 04/17/2015   Asthma 11/23/2012    Past Surgical History:  Procedure Laterality Date   HAND TUMOR EXCISION     MASS EXCISION Left 05/02/2015   Procedure: EXCISION MASS LEFT LONG FINGER;  Surgeon: Betha Loa, MD;  Location: [facility];  Service: Orthopedics;  Laterality: Left;   NO PAST SURGERIES      OB History     Gravida  2   Para  2   Term  2   Preterm  0   AB  0   Living  2      SAB  0   IAB  0   Ectopic  0   Multiple  0   Live Births  2            Home Medications    Prior to Admission medications   Medication Sig Start Date End Date Taking? Authorizing Provider  doxycycline (MONODOX) 100 MG capsule Take 1 capsule (100 mg total) by mouth 2 (two) times  daily. 09/03/21  Yes Bing Neighbors, FNP  naproxen (NAPROSYN) 375 MG tablet Take 1 tablet (375 mg total) by mouth 2 (two) times daily. 09/03/21  Yes Bing Neighbors, FNP  triamcinolone (KENALOG) 0.025 % ointment Apply 1 application topically 2 (two) times daily. 09/03/21  Yes Bing Neighbors, FNP  HYDROcodone-acetaminophen (NORCO/VICODIN) 5-325 MG tablet Take 1 tablet by mouth every 6 (six) hours as needed for moderate pain or severe pain. 11/28/19   Mardella Layman, MD  ibuprofen (ADVIL) 800 MG tablet Take 1 tablet (800 mg total) by mouth 3 (three) times daily with meals. 11/28/19   Mardella Layman, MD  methocarbamol (ROBAXIN) 500 MG tablet Take 1 tablet (500 mg total) by mouth 2 (two) times daily. 06/01/17   Elson Areas, PA-C    Family History Family History  Problem Relation Age of Onset   Diabetes Mother    Sleep apnea Father    Hypertension Paternal Grandmother    Heart disease Paternal Grandmother     Social History Social History   Tobacco Use   Smoking status: Former    Packs/day: 0.00    Types: Cigarettes  Quit date: 09/20/2014    Years since quitting: 6.9   Smokeless tobacco: Never  Substance Use Topics   Alcohol use: No   Drug use: No     Allergies   Keflex [cephalexin]   Review of Systems Review of Systems Pertinent negatives listed in HPI   Physical Exam Triage Vital Signs ED Triage Vitals  Enc Vitals Group     BP 09/03/21 1941 133/82     Pulse Rate 09/03/21 1941 82     Resp 09/03/21 1941 18     Temp 09/03/21 1941 98.5 F (36.9 C)     Temp Source 09/03/21 1941 Oral     SpO2 09/03/21 1941 93 %     Weight --      Height --      Head Circumference --      Peak Flow --      Pain Score 09/03/21 1942 2     Pain Loc --      Pain Edu? --      Excl. in GC? --    No data found.  Updated Vital Signs BP 133/82 (BP Location: Left Arm)    Pulse 82    Temp 98.5 F (36.9 C) (Oral)    Resp 18    LMP 08/23/2021    SpO2 93%   Visual Acuity Right  Eye Distance:   Left Eye Distance:   Bilateral Distance:    Right Eye Near:   Left Eye Near:    Bilateral Near:     Physical Exam General appearance:Alert, well developed, well nourished, cooperative Head: Normocephalic, without obvious abnormality, atraumatic Respiratory: Respirations even and unlabored, normal respiratory rate Heart: Rate and rhythm normal.   Upper mid thoracic 8 mm indurated fluctuant abscess lesion surrounded by a papular rash and dry thickened skin Extremities: No gross deformities Psych: Appropriate mood and affect. Neurologic: Mental status: Alert, oriented to person, place, and time, thought content appropriate.   UC Treatments / Results  Labs (all labs ordered are listed, but only abnormal results are displayed) Labs Reviewed - No data to display  EKG   Radiology No results found.  Procedures Incision and Drainage  Date/Time: 09/03/2021 8:31 PM Performed by: Bing Neighbors, FNP Authorized by: Bing Neighbors, FNP   Consent:    Consent obtained:  Verbal   Consent given by:  Patient   Risks discussed:  Incomplete drainage Universal protocol:    Patient identity confirmed:  Verbally with patient Location:    Type:  Abscess   Location:  Trunk   Trunk location:  Back Pre-procedure details:    Skin preparation:  Povidone-iodine Sedation:    Sedation type:  None Anesthesia:    Anesthesia method:  Local infiltration   Local anesthetic:  Lidocaine 2% WITH epi Procedure type:    Complexity:  Simple Procedure details:    Ultrasound guidance: no     Needle aspiration: no     Incision types:  Stab incision   Incision depth:  Dermal   Drainage:  Purulent   Drainage amount:  Copious Post-procedure details:    Procedure completion:  Tolerated (including critical care time)  Medications Ordered in UC Medications  ketorolac (TORADOL) injection 60 mg (60 mg Intramuscular Given 09/03/21 2014)    Initial Impression / Assessment and  Plan / UC Course  I have reviewed the triage vital signs and the nursing notes.  Pertinent labs & imaging results that were available during my care of the patient were  reviewed by me and considered in my medical decision making (see chart for details).    Treated today with doxycycline twice daily x10 days. Triamcinolone cream to be applied to rash as needed twice daily. Naprosyn for pain. Patient received Toradol IM injection here in clinic for pain relief following procedure. Wound was dressed with a nonadherent dressing. Patient tolerated procedure. Return precautions given  Final Clinical Impressions(s) / UC Diagnoses   Final diagnoses:  Dermatitis  Abscess of back   Discharge Instructions   None    ED Prescriptions     Medication Sig Dispense Auth. Provider   doxycycline (MONODOX) 100 MG capsule Take 1 capsule (100 mg total) by mouth 2 (two) times daily. 20 capsule Bing Neighbors, FNP   triamcinolone (KENALOG) 0.025 % ointment Apply 1 application topically 2 (two) times daily. 454 g Bing Neighbors, FNP   naproxen (NAPROSYN) 375 MG tablet Take 1 tablet (375 mg total) by mouth 2 (two) times daily. 20 tablet Bing Neighbors, FNP      PDMP not reviewed this encounter.   Bing Neighbors, FNP 09/03/21 2036

## 2021-09-21 NOTE — L&D Delivery Note (Signed)
Delivery Note At 11:39 AM a viable female was delivered via Vaginal, Spontaneous (Presentation:   Occiput Anterior).  APGAR: 8, 9; weight  .   Placenta status: Spontaneous, Intact.  Cord: 3 vessels with the following complications: None.  Cord pH: na  Anesthesia: Epidural Episiotomy: None Lacerations: None Suture Repair:  none Est. Blood Loss (mL): 235  Mom to postpartum.  Baby to Couplet care / Skin to Skin.  Zylee Marchiano A Payden Docter 07/25/2022, 12:11 PM

## 2022-01-05 ENCOUNTER — Encounter (HOSPITAL_COMMUNITY): Payer: Self-pay | Admitting: Obstetrics and Gynecology

## 2022-01-05 ENCOUNTER — Inpatient Hospital Stay (HOSPITAL_COMMUNITY)
Admission: AD | Admit: 2022-01-05 | Discharge: 2022-01-06 | Disposition: A | Discharge status: Home / Self Care | Payer: Medicaid Other | Attending: Obstetrics and Gynecology | Admitting: Obstetrics and Gynecology

## 2022-01-05 ENCOUNTER — Inpatient Hospital Stay (HOSPITAL_COMMUNITY): Payer: Medicaid Other

## 2022-01-05 ENCOUNTER — Other Ambulatory Visit: Payer: Self-pay

## 2022-01-05 ENCOUNTER — Ambulatory Visit (HOSPITAL_COMMUNITY)
Admission: EM | Admit: 2022-01-05 | Discharge: 2022-01-05 | Disposition: A | Discharge status: Home / Self Care | Payer: Self-pay | Attending: Nurse Practitioner | Admitting: Nurse Practitioner

## 2022-01-05 ENCOUNTER — Encounter (HOSPITAL_COMMUNITY): Payer: Self-pay | Admitting: *Deleted

## 2022-01-05 DIAGNOSIS — Z3A10 10 weeks gestation of pregnancy: Secondary | ICD-10-CM | POA: Diagnosis not present

## 2022-01-05 DIAGNOSIS — IMO0001 Reserved for inherently not codable concepts without codable children: Secondary | ICD-10-CM

## 2022-01-05 DIAGNOSIS — Z3491 Encounter for supervision of normal pregnancy, unspecified, first trimester: Secondary | ICD-10-CM

## 2022-01-05 DIAGNOSIS — O208 Other hemorrhage in early pregnancy: Secondary | ICD-10-CM | POA: Diagnosis not present

## 2022-01-05 DIAGNOSIS — O209 Hemorrhage in early pregnancy, unspecified: Secondary | ICD-10-CM | POA: Diagnosis present

## 2022-01-05 DIAGNOSIS — Z3201 Encounter for pregnancy test, result positive: Secondary | ICD-10-CM

## 2022-01-05 DIAGNOSIS — N939 Abnormal uterine and vaginal bleeding, unspecified: Secondary | ICD-10-CM

## 2022-01-05 DIAGNOSIS — R109 Unspecified abdominal pain: Secondary | ICD-10-CM

## 2022-01-05 DIAGNOSIS — O418X10 Other specified disorders of amniotic fluid and membranes, first trimester, not applicable or unspecified: Secondary | ICD-10-CM

## 2022-01-05 DIAGNOSIS — Z3490 Encounter for supervision of normal pregnancy, unspecified, unspecified trimester: Secondary | ICD-10-CM

## 2022-01-05 LAB — POC URINE PREG, ED: Preg Test, Ur: POSITIVE — AB

## 2022-01-05 LAB — URINALYSIS, ROUTINE W REFLEX MICROSCOPIC
Bilirubin Urine: NEGATIVE
Glucose, UA: NEGATIVE mg/dL
Hgb urine dipstick: NEGATIVE
Ketones, ur: NEGATIVE mg/dL
Nitrite: NEGATIVE
Protein, ur: NEGATIVE mg/dL
Specific Gravity, Urine: 1.023 (ref 1.005–1.030)
pH: 6 (ref 5.0–8.0)

## 2022-01-05 LAB — CBC
HCT: 39.2 % (ref 36.0–46.0)
Hemoglobin: 13 g/dL (ref 12.0–15.0)
MCH: 27.6 pg (ref 26.0–34.0)
MCHC: 33.2 g/dL (ref 30.0–36.0)
MCV: 83.2 fL (ref 80.0–100.0)
Platelets: 288 10*3/uL (ref 150–400)
RBC: 4.71 MIL/uL (ref 3.87–5.11)
RDW: 16.5 % — ABNORMAL HIGH (ref 11.5–15.5)
WBC: 14 10*3/uL — ABNORMAL HIGH (ref 4.0–10.5)
nRBC: 0 % (ref 0.0–0.2)

## 2022-01-05 LAB — HCG, QUANTITATIVE, PREGNANCY: hCG, Beta Chain, Quant, S: 72373 m[IU]/mL — ABNORMAL HIGH (ref <5)

## 2022-01-05 MED ORDER — OXYCODONE-ACETAMINOPHEN 5-325 MG PO TABS
2.0000 | ORAL_TABLET | Freq: Once | ORAL | Status: AC
  Administered 2022-01-05: 2 via ORAL
  Filled 2022-01-05: qty 2

## 2022-01-05 NOTE — Discharge Instructions (Signed)
[facility]   [facility]             [address], [place], [place] [address] [phone]  [facility]                                                             [address], [address], [place], [place], [address] [phone]  [facility]                                    [address], [address], [place], [place], [address] [phone]  [facility] [place] [address], [address], [place], [place], [address] [phone]  [facility]                                 [address], [place], [place], [address] [phone]  [facility]                                    [address], [place], [place], [address] [phone]  [facility] [address], [address], [place], [place], [address]                              [facility] [place] [address], [place], [place], [address] [phone]  [facility]         Phone: [phone]  [facility]      Phone: [phone]   [facility]      Phone: [phone]  [facility]         Phone: [phone]   [facility]    Phone: [phone]  [facility]      Phone: [phone]  [facility]     Phone: [phone]  [facility]        Phone: [phone]  [facility]      Phone: [phone]  

## 2022-01-05 NOTE — MAU Note (Signed)
..  Leslie Mayer is a 31 y.o. at Unknown here in MAU reporting: severe bright red VB like a period and constant ABD cramping since 1700. Pt was seen in [facility] earlier tonight and was sent to MAU for further eval. Pt had a POS POC preg urine there, and told of concern for miscarriage. Pt did not know she was pregnant prior. Pt denies abnormal discharge, clots, and LOF. Last intercourse was over a week ago. ?LMP: 10/24/2021 ?Onset of complaint: 1700 ?Pain score: 10/10 ?Vitals:  ? 01/05/22 2122  ?BP: 137/70  ?Pulse: 99  ?Resp: 18  ?Temp: 98.4 ?F (36.9 ?C)  ?SpO2: 98%  ?   ? ?Lab orders placed from triage:  UA ? ?

## 2022-01-05 NOTE — ED Triage Notes (Signed)
Pt also has a boil ion her back. ?

## 2022-01-05 NOTE — ED Triage Notes (Signed)
Pt reports a positive home opreg test the patient now has ABD cramping and Vag bleeding. ?

## 2022-01-05 NOTE — ED Triage Notes (Signed)
Pt has a Positive pregnancy test tonight. ?

## 2022-01-05 NOTE — MAU Provider Note (Addendum)
?History  ? [id number] ? ?Arrival date and time: 01/05/22 2037 ?  ?Chief Complaint  ?Patient presents with  ? Abdominal Pain  ? Vaginal Bleeding  ? ?HPI ?Leslie Mayer is a 31 y.o. at [redacted]w[redacted]d by LMP with PMHx notable for asthma, who presents for vaginal bleeding & abdominal cramping. ?Reports heavy vaginal bleeding like a period started this morning. Intense abdominal cramping started around 5 pm. Reports history of irregular periods & did not think she was pregnant but had a positive test at urgent care & was sent here.  ?Denies fever, n/v, dysuria, vaginal discharge, or recent intercourse.  ?States she saturated a pad within 30 minutes earlier today but since then the bleeding has decreased.   ? ?OB History   ? ? Gravida  ?3  ? Para  ?2  ? Term  ?2  ? Preterm  ?0  ? AB  ?0  ? Living  ?2  ?  ? ? SAB  ?0  ? IAB  ?0  ? Ectopic  ?0  ? Multiple  ?0  ? Live Births  ?2  ?   ?  ?  ? ?Past Medical History:  ?Diagnosis Date  ? Asthma   ? prn inhaler  ? GERD (gastroesophageal reflux disease)   ? no current med.  ? Obesity 11/23/2012  ? Pyogenic granuloma 04/2015  ? left long finger  ? ?Past Surgical History:  ?Procedure Laterality Date  ? HAND TUMOR EXCISION    ? MASS EXCISION Left 05/02/2015  ? Procedure: EXCISION MASS LEFT LONG FINGER;  Surgeon: Betha Loa, MD;  Location: [facility];  Service: Orthopedics;  Laterality: Left;  ? NO PAST SURGERIES    ? ?Family History  ?Problem Relation Age of Onset  ? Diabetes Mother   ? Sleep apnea Father   ? Hypertension Paternal Grandmother   ? Heart disease Paternal Grandmother   ? ?Allergies  ?Allergen Reactions  ? Keflex [Cephalexin] Other (See Comments)  ?  SKIN PEELED FROM HANDS  ? ?No current facility-administered medications on file prior to encounter.  ? ?No current outpatient medications on file prior to encounter.  ? ?ROS ?Pertinent positives and negative per HPI, all others reviewed and negative ? ?Physical Exam  ? ?BP 137/70 (BP Location: Right Arm)   Pulse 99    Temp 98.4 ?F (36.9 ?C) (Oral)   Resp 18   Ht 5\' 4"  (1.626 m)   Wt 258 lb 12.8 oz (117.4 kg)   LMP 10/24/2021 (Approximate)   SpO2 98%   BMI 44.42 kg/m?  ? ?Patient Vitals for the past 24 hrs: ? BP Temp Temp src Pulse Resp SpO2 Height Weight  ?01/05/22 2122 137/70 98.4 ?F (36.9 ?C) Oral 99 18 98 % 5\' 4"  (1.626 m) 258 lb 12.8 oz (117.4 kg)  ? ? ?Physical Exam ?Vitals and nursing note reviewed. Exam conducted with a chaperone present.  ?Constitutional:   ?   General: She is not in acute distress. ?   Appearance: She is well-developed.  ?HENT:  ?   Head: Normocephalic and atraumatic.  ?Pulmonary:  ?   Effort: Pulmonary effort is normal. No respiratory distress.  ?Abdominal:  ?   Palpations: Abdomen is soft.  ?   Tenderness: There is no abdominal tenderness.  ?Genitourinary: ?   Comments: Pelvic exam: scant amount of white mucoid discharge. No blood. Cervix closed. Cervix not friable.  ?Skin: ?   General: Skin is warm and dry.  ?Neurological:  ?  Mental Status: She is alert.  ?Psychiatric:     ?   Mood and Affect: Mood normal.     ?   Behavior: Behavior normal.  ?  ?Labs ?Results for orders placed or performed during the hospital encounter of 01/05/22 (from the past 24 hour(s))  ?Urinalysis, Routine w reflex microscopic     Status: Abnormal  ? Collection Time: 01/05/22  8:58 PM  ?Result Value Ref Range  ? Color, Urine YELLOW YELLOW  ? APPearance HAZY (A) CLEAR  ? Specific Gravity, Urine 1.023 1.005 - 1.030  ? pH 6.0 5.0 - 8.0  ? Glucose, UA NEGATIVE NEGATIVE mg/dL  ? Hgb urine dipstick NEGATIVE NEGATIVE  ? Bilirubin Urine NEGATIVE NEGATIVE  ? Ketones, ur NEGATIVE NEGATIVE mg/dL  ? Protein, ur NEGATIVE NEGATIVE mg/dL  ? Nitrite NEGATIVE NEGATIVE  ? Leukocytes,Ua MODERATE (A) NEGATIVE  ? RBC / HPF 0-5 0 - 5 RBC/hpf  ? WBC, UA 0-5 0 - 5 WBC/hpf  ? Bacteria, UA RARE (A) NONE SEEN  ? Squamous Epithelial / LPF 11-20 0 - 5  ? Mucus PRESENT   ?CBC     Status: Abnormal  ? Collection Time: 01/05/22  9:41 PM  ?Result Value  Ref Range  ? WBC 14.0 (H) 4.0 - 10.5 K/uL  ? RBC 4.71 3.87 - 5.11 MIL/uL  ? Hemoglobin 13.0 12.0 - 15.0 g/dL  ? HCT 39.2 36.0 - 46.0 %  ? MCV 83.2 80.0 - 100.0 fL  ? MCH 27.6 26.0 - 34.0 pg  ? MCHC 33.2 30.0 - 36.0 g/dL  ? RDW 16.5 (H) 11.5 - 15.5 %  ? Platelets 288 150 - 400 K/uL  ? nRBC 0.0 0.0 - 0.2 %  ?ABO/Rh     Status: None  ? Collection Time: 01/05/22  9:41 PM  ?Result Value Ref Range  ? ABO/RH(D)    ?  O POS ?Performed at [facility], [address], [place], [place] [address] ?  ?hCG, quantitative, pregnancy     Status: Abnormal  ? Collection Time: 01/05/22  9:41 PM  ?Result Value Ref Range  ? hCG, Beta Chain, Quant, S 72,373 (H) <5 mIU/mL  ? ? ?Imaging ?US OB Comp Less 14 Wks ? ?Result Date: 01/05/2022 ?CLINICAL DATA:  Bleeding and abdominal pain. EXAM: OBSTETRIC <14 WK [place] AND TRANSVAGINAL OB US TECHNIQUE: Both transabdominal and transvaginal ultrasound examinations were performed for complete evaluation of the gestation as well as the maternal uterus, adnexal regions, and pelvic cul-de-sac. Transvaginal technique was performed to assess early pregnancy. COMPARISON:  None. FINDINGS: Intrauterine gestational sac: Single Yolk sac:  Not Visualized. Embryo:  Visualized. Cardiac Activity: Visualized. Heart Rate: 167 bpm CRL:  36.4 mm   10 w   4 d                  [place] EDC: 07/30/2022 Subchorionic hemorrhage:  Small subchorionic hemorrhage is present. Maternal uterus/adnexae: Corpus luteum noted in the right ovary. Left ovary not visualized. No pelvic free fluid. IMPRESSION: 1. Single live intrauterine gestation measuring 10 weeks 4 days by crown-rump length. 2. Small subchorionic hemorrhage. Electronically Signed   By: Darliss Cheney M.D.   On: 01/05/2022 22:18   ? ?MAU Course  ?Procedures ?Lab Orders    ?     Urinalysis, Routine w reflex microscopic    ?     CBC    ?     hCG, quantitative, pregnancy    ? ?Meds ordered this encounter  ?  Medications  ? oxyCODONE-acetaminophen (PERCOCET/ROXICET) 5-325 MG per  tablet 2 tablet  ? ?Imaging Orders    ?     US OB Comp Less 14 Wks    ? ?MDM ?+UPT ?UA, CBC, ABO/Rh, quant hCG, and US today to rule out ectopic pregnancy which can be life threatening.  ? ?RH positive ?No blood on exam.  ?Patient requests pain medication prior to ultrasound. Percocet ordered.  ? ?Care turned over to Edd ArbourJamilla Sylvana Bonk CNM ?Judeth HornLawrence, Erin, NP 01/05/2022 9:55 PM ? ?Informed patient of IUP at 1673w4d and presence of small SCH. Advised pelvic rest and Tylenol for cramping (reassured of safety in pregnancy), reviewed reasons to return to MAU. Discussed options for obtaining prenatal care, pt [facility]  ? ?Assessment and Plan  ?Intrauterine pregnancy - Plan: Discharge patient ? ?Fetal heart tones present, first trimester - Plan: Discharge patient ? ?Subchorionic hemorrhage of placenta in first trimester, single or unspecified fetus - Plan: Discharge patient ? ?[redacted] weeks gestation of pregnancy - Plan: Discharge patient  ? ?Allergies as of 01/06/2022   ? ?   Reactions  ? Keflex [cephalexin] Other (See Comments)  ? SKIN PEELED FROM HANDS  ? ?  ? ?  ?Medication List  ?  ?You have not been prescribed any medications. ?  ?  ?Edd ArbourJamilla Pristine Gladhill, CNM, MSN, IBCLC ?Certified Nurse Midwife, [facility] ? ?  ? ?

## 2022-01-05 NOTE — ED Provider Notes (Signed)
?[place] ? ? ? ?CSN: [id number] ?Arrival date & time: 01/05/22  1948 ? ? ?  ? ?History   ?Chief Complaint ?Chief Complaint  ?Patient presents with  ? Abdominal Cramping  ? Vaginal Bleeding  ? Abscess  ? ? ?HPI ?Leslie Mayer is a 31 y.o. female.  ? ?Patient presents to urgent care with 1 day of vaginal bleeding and abdominal cramping.  She reports she had a home pregnancy test that was positive about 1 to 2 weeks ago. ? ?Patient is also concerned that she is having drainage on her back-reports she had a boil drained here a couple of months ago. ? ? ?Past Medical History:  ?Diagnosis Date  ? Asthma   ? prn inhaler  ? GERD (gastroesophageal reflux disease)   ? no current med.  ? Obesity 11/23/2012  ? Pyogenic granuloma 04/2015  ? left long finger  ? ? ?Patient Active Problem List  ? Diagnosis Date Noted  ? Vaginal delivery 04/17/2015  ? Positive GBS test 04/17/2015  ? MRSA (methicillin resistant staph aureus) culture positive 04/17/2015  ? Morbid obesity ([place]) 04/17/2015  ? Allergy to cephalosporin--keflex 04/17/2015  ? Asthma 11/23/2012  ? ? ?Past Surgical History:  ?Procedure Laterality Date  ? HAND TUMOR EXCISION    ? MASS EXCISION Left 05/02/2015  ? Procedure: EXCISION MASS LEFT LONG FINGER;  Surgeon: Leanora Cover, MD;  Location: [facility];  Service: Orthopedics;  Laterality: Left;  ? NO PAST SURGERIES    ? ? ?OB History   ? ? Gravida  ?2  ? Para  ?2  ? Term  ?2  ? Preterm  ?0  ? AB  ?0  ? Living  ?2  ?  ? ? SAB  ?0  ? IAB  ?0  ? Ectopic  ?0  ? Multiple  ?0  ? Live Births  ?2  ?   ?  ?  ? ? ? ?Home Medications   ? ?Prior to Admission medications   ?Medication Sig Start Date End Date Taking? Authorizing Provider  ?doxycycline (MONODOX) 100 MG capsule Take 1 capsule (100 mg total) by mouth 2 (two) times daily. 09/03/21   Scot Jun, FNP  ?HYDROcodone-acetaminophen (NORCO/VICODIN) 5-325 MG tablet Take 1 tablet by mouth every 6 (six) hours as needed for moderate pain or severe pain.  11/28/19   Vanessa Kick, MD  ?ibuprofen (ADVIL) 800 MG tablet Take 1 tablet (800 mg total) by mouth 3 (three) times daily with meals. 11/28/19   Vanessa Kick, MD  ?methocarbamol (ROBAXIN) 500 MG tablet Take 1 tablet (500 mg total) by mouth 2 (two) times daily. 06/01/17   Fransico Meadow, PA-C  ?naproxen (NAPROSYN) 375 MG tablet Take 1 tablet (375 mg total) by mouth 2 (two) times daily. 09/03/21   Scot Jun, FNP  ?triamcinolone (KENALOG) 0.025 % ointment Apply 1 application topically 2 (two) times daily. 09/03/21   Scot Jun, FNP  ? ? ?Family History ?Family History  ?Problem Relation Age of Onset  ? Diabetes Mother   ? Sleep apnea Father   ? Hypertension Paternal Grandmother   ? Heart disease Paternal Grandmother   ? ? ?Social History ?Social History  ? ?Tobacco Use  ? Smoking status: Former  ?  Packs/day: 0.00  ?  Types: Cigarettes  ?  Quit date: 09/20/2014  ?  Years since quitting: 7.2  ? Smokeless tobacco: Never  ?Substance Use Topics  ? Alcohol use: No  ? Drug use:  No  ? ? ? ?Allergies   ?Keflex [cephalexin] ? ? ?Review of Systems ?Review of Systems ?Per HPI ? ?Physical Exam ?Triage Vital Signs ?ED Triage Vitals  ?Enc Vitals Group  ?   BP 01/05/22 2011 128/83  ?   Pulse Rate 01/05/22 2011 86  ?   Resp 01/05/22 2011 18  ?   Temp 01/05/22 2011 98.1 ?F (36.7 ?C)  ?   Temp src --   ?   SpO2 01/05/22 2011 98 %  ?   Weight --   ?   Height --   ?   Head Circumference --   ?   Peak Flow --   ?   Pain Score 01/05/22 2009 8  ?   Pain Loc --   ?   Pain Edu? --   ?   Excl. in [place]? --   ? ?No data found. ? ?Updated Vital Signs ?BP 128/83   Pulse 86   Temp 98.1 ?F (36.7 ?C)   Resp 18   LMP 11/01/2021   SpO2 98%  ? ?Visual Acuity ?Right Eye Distance:   ?Left Eye Distance:   ?Bilateral Distance:   ? ?Right Eye Near:   ?Left Eye Near:    ?Bilateral Near:    ? ?Physical Exam ?Vitals and nursing note reviewed.  ?Constitutional:   ?   General: She is not in acute distress. ?   Appearance: She is obese. She is not  toxic-appearing.  ?Skin: ?   General: Skin is warm and dry.  ?   Coloration: Skin is not jaundiced or pale.  ?   Findings: No erythema.  ?Neurological:  ?   Mental Status: She is alert and oriented to person, place, and time.  ?Psychiatric:     ?   Mood and Affect: Mood normal.     ?   Behavior: Behavior normal.  ? ? ? ?UC Treatments / Results  ?Labs ?(all labs ordered are listed, but only abnormal results are displayed) ?Labs Reviewed  ?POC URINE PREG, ED - Abnormal; Notable for the following components:  ?    Result Value  ? Preg Test, Ur POSITIVE (*)   ? All other components within normal limits  ? ? ?EKG ? ? ?Radiology ?No results found. ? ?Procedures ?Procedures (including critical care time) ? ?Medications Ordered in UC ?Medications - No data to display ? ?Initial Impression / Assessment and Plan / UC Course  ?I have reviewed the triage vital signs and the nursing notes. ? ?Pertinent labs & imaging results that were available during my care of the patient were reviewed by me and considered in my medical decision making (see chart for details). ? ?  ?Urine pregnancy test today is positive.  I am concerned for miscarriage given abdominal cramping and vaginal bleeding.  We discussed that this requires further work-up at the [facility] emergency room for lab work, ultrasound, etc.  She verbalizes understanding.  I recommended she leave urgent care and go straight there-she agrees to this. The patient was given the opportunity to ask questions.  All questions answered to their satisfaction.  The patient is in agreement to this plan.  ? ?Final Clinical Impressions(s) / UC Diagnoses  ? ?Final diagnoses:  ?Positive pregnancy test  ?Vaginal bleeding  ?Abdominal cramping  ? ? ? ?Discharge Instructions   ? ?  ?Please go directly to the MAU [facility] Emergency Room) for further evaluation and management ? ? ? ?ED Prescriptions   ?None ?  ? ?  PDMP not reviewed this encounter. ?  ?Eulogio Bear, NP ?01/05/22 2035 ? ?

## 2022-01-05 NOTE — Discharge Instructions (Signed)
Please go directly to the MAU [facility] Emergency Room) for further evaluation and management ?

## 2022-01-06 LAB — ABO/RH: ABO/RH(D): O POS

## 2022-02-10 ENCOUNTER — Encounter: Payer: Self-pay | Admitting: Advanced Practice Midwife

## 2022-02-10 ENCOUNTER — Other Ambulatory Visit: Payer: Self-pay

## 2022-02-10 ENCOUNTER — Inpatient Hospital Stay (EMERGENCY_DEPARTMENT_HOSPITAL)
Admission: AD | Admit: 2022-02-10 | Discharge: 2022-02-11 | Disposition: A | Discharge status: Home / Self Care | Payer: Medicaid Other | Source: Home / Self Care | Attending: Obstetrics & Gynecology | Admitting: Obstetrics & Gynecology

## 2022-02-10 ENCOUNTER — Inpatient Hospital Stay (HOSPITAL_COMMUNITY)
Admission: AD | Admit: 2022-02-10 | Discharge: 2022-02-10 | Disposition: A | Discharge status: Home / Self Care | Payer: Medicaid Other | Attending: Obstetrics and Gynecology | Admitting: Obstetrics and Gynecology

## 2022-02-10 ENCOUNTER — Encounter (HOSPITAL_COMMUNITY): Payer: Self-pay | Admitting: Obstetrics and Gynecology

## 2022-02-10 DIAGNOSIS — O219 Vomiting of pregnancy, unspecified: Secondary | ICD-10-CM | POA: Diagnosis not present

## 2022-02-10 DIAGNOSIS — Z20822 Contact with and (suspected) exposure to covid-19: Secondary | ICD-10-CM | POA: Insufficient documentation

## 2022-02-10 DIAGNOSIS — J069 Acute upper respiratory infection, unspecified: Secondary | ICD-10-CM | POA: Insufficient documentation

## 2022-02-10 DIAGNOSIS — F17210 Nicotine dependence, cigarettes, uncomplicated: Secondary | ICD-10-CM | POA: Insufficient documentation

## 2022-02-10 DIAGNOSIS — J029 Acute pharyngitis, unspecified: Secondary | ICD-10-CM | POA: Insufficient documentation

## 2022-02-10 DIAGNOSIS — R051 Acute cough: Secondary | ICD-10-CM | POA: Diagnosis not present

## 2022-02-10 DIAGNOSIS — Z3A15 15 weeks gestation of pregnancy: Secondary | ICD-10-CM | POA: Insufficient documentation

## 2022-02-10 DIAGNOSIS — O99512 Diseases of the respiratory system complicating pregnancy, second trimester: Secondary | ICD-10-CM | POA: Insufficient documentation

## 2022-02-10 DIAGNOSIS — O26892 Other specified pregnancy related conditions, second trimester: Secondary | ICD-10-CM | POA: Diagnosis present

## 2022-02-10 DIAGNOSIS — O98512 Other viral diseases complicating pregnancy, second trimester: Secondary | ICD-10-CM | POA: Diagnosis not present

## 2022-02-10 DIAGNOSIS — Z881 Allergy status to other antibiotic agents status: Secondary | ICD-10-CM | POA: Insufficient documentation

## 2022-02-10 LAB — RESPIRATORY PANEL BY PCR

## 2022-02-10 MED ORDER — GUAIFENESIN ER 600 MG PO TB12
600.0000 mg | ORAL_TABLET | Freq: Once | ORAL | Status: AC
  Administered 2022-02-11: 600 mg via ORAL
  Filled 2022-02-10 (×2): qty 1

## 2022-02-10 MED ORDER — PSEUDOEPHEDRINE HCL 30 MG PO TABS
60.0000 mg | ORAL_TABLET | Freq: Once | ORAL | Status: AC
  Administered 2022-02-10: 60 mg via ORAL
  Filled 2022-02-10: qty 2

## 2022-02-10 MED ORDER — BENZONATATE 100 MG PO CAPS: 200.0000 mg | ORAL_CAPSULE | Freq: Two times a day (BID) | ORAL | 0 refills | Status: DC | PRN

## 2022-02-10 MED ORDER — BENZONATATE 100 MG PO CAPS
200.0000 mg | ORAL_CAPSULE | Freq: Once | ORAL | Status: AC
  Administered 2022-02-10: 200 mg via ORAL
  Filled 2022-02-10: qty 2

## 2022-02-10 MED ORDER — ONDANSETRON 4 MG PO TBDP
8.0000 mg | ORAL_TABLET | Freq: Once | ORAL | Status: AC
  Administered 2022-02-10: 8 mg via ORAL
  Filled 2022-02-10: qty 2

## 2022-02-10 NOTE — MAU Provider Note (Signed)
Chief Complaint:  Sore Throat   Event Date/Time   First Provider Initiated Contact with Patient 02/10/22 2306    HPI: Leslie Mayer is a 31 y.o. G3P2002 at 38w4dwho presents to maternity admissions reporting sore throat and cough. Was seen here this morning for the same symptoms.  States throat hurts worse.  Is sure it is strep and wants a "shot" to make it better. Already taking Amoxiicillin for a bad tooth. Strep culture is pending.  They did a 20-item viral panel which was entirely negative.   Gets dizzy when coughing spells come.  Has not taken anything for it . She denies LOF, vaginal bleeding, vaginal itching/burning, urinary symptoms, h/a, n/v, diarrhea, constipation or fever/chills.   Sore Throat  This is a recurrent problem. The current episode started yesterday. The problem has been gradually worsening. There has been no fever. Associated symptoms include congestion, coughing and trouble swallowing. Pertinent negatives include no abdominal pain, diarrhea, ear pain, headaches, hoarse voice, shortness of breath or vomiting. She has tried nothing for the symptoms.    RN Note: Leslie Mayer is a 31 y.o. at [redacted]w[redacted]d here in MAU reporting extreme sore throat. Was seen here this am and swabbed for covid and Strept Throat. Covid negative. Pt's throat is very sore and difficult to swallow. On Amoxicillin for 3 days for a bad tooth that is being removed on 5/30. When I cough I feel like I am gonna pass out. No one at home sick  Past Medical History: Past Medical History:  Diagnosis Date   Asthma    prn inhaler   GERD (gastroesophageal reflux disease)    no current med.   Obesity 11/23/2012   Pyogenic granuloma 04/2015   left long finger    Past obstetric history: OB History  Gravida Para Term Preterm AB Living  3 2 2  0 0 2  SAB IAB Ectopic Multiple Live Births  0 0 0 0 2    # Outcome Date GA Lbr Len/2nd Weight Sex Delivery Anes PTL Lv  3 Current           2 Term 04/17/15 [redacted]w[redacted]d  15:05 / 01:04 4876 g F Vag-Spont EPI  LIV  1 Term 05/19/13 [redacted]w[redacted]d 21:24 / 01:28 4051 g F Vag-Spont EPI  LIV    Past Surgical History: Past Surgical History:  Procedure Laterality Date   HAND TUMOR EXCISION     MASS EXCISION Left 05/02/2015   Procedure: EXCISION MASS LEFT LONG FINGER;  Surgeon: Leanora Cover, MD;  Location: [facility];  Service: Orthopedics;  Laterality: Left;   NO PAST SURGERIES      Family History: Family History  Problem Relation Age of Onset   Diabetes Mother    Sleep apnea Father    Hypertension Paternal Grandmother    Heart disease Paternal Grandmother     Social History: Social History   Tobacco Use   Smoking status: Former    Packs/day: 0.00    Types: Cigarettes    Quit date: 09/20/2014    Years since quitting: 7.3   Smokeless tobacco: Never  Substance Use Topics   Alcohol use: No   Drug use: No    Allergies:  Allergies  Allergen Reactions   Keflex [Cephalexin] Other (See Comments)    SKIN PEELED FROM HANDS    Meds:  Medications Prior to Admission  Medication Sig Dispense Refill Last Dose   acetaminophen-codeine (TYLENOL #3) 300-30 MG tablet Take by mouth every 4 (four) hours as needed  for moderate pain.      amoxicillin (AMOXIL) 500 MG tablet Take 500 mg by mouth 2 (two) times daily.      benzonatate (TESSALON PERLES) 100 MG capsule Take 2 capsules (200 mg total) by mouth 2 (two) times daily as needed for cough. 20 capsule 0     I have reviewed patient's Past Medical Hx, Surgical Hx, Family Hx, Social Hx, medications and allergies.   ROS:  Review of Systems  HENT:  Positive for congestion and trouble swallowing. Negative for ear pain and hoarse voice.   Respiratory:  Positive for cough. Negative for shortness of breath.   Gastrointestinal:  Negative for abdominal pain, diarrhea and vomiting.  Neurological:  Negative for headaches.  Other systems negative  Physical Exam  Patient Vitals for the past 24 hrs:  BP Temp  Pulse Resp SpO2 Height Weight  02/10/22 2154 135/74 100.2 F (37.9 C) (!) 122 (!) 22 97 % 5\' 4"  (1.626 m) 123.8 kg   Constitutional: Well-developed, well-nourished female in no acute distress.  Cardiovascular: normal rate and rhythm Respiratory: normal effort, clear to auscultation bilaterally Throat somewhat erethematous, no pus seen.  GI: Abd soft, non-tender, gravid appropriate for gestational age.   No rebound or guarding. MS: Extremities nontender, no edema, normal ROM Neurologic: Alert and oriented x 4.   FHT:  174   Labs (done earlier today): Results for orders placed or performed during the hospital encounter of 02/10/22 (from the past 24 hour(s))  Respiratory (~20 pathogens) panel by PCR     Status: None   Collection Time: 02/10/22  4:08 AM   Specimen: Nasopharyngeal Swab; Respiratory  Result Value Ref Range   Adenovirus NOT DETECTED NOT DETECTED   Coronavirus 229E NOT DETECTED NOT DETECTED   Coronavirus HKU1 NOT DETECTED NOT DETECTED   Coronavirus NL63 NOT DETECTED NOT DETECTED   Coronavirus OC43 NOT DETECTED NOT DETECTED   Metapneumovirus NOT DETECTED NOT DETECTED   Rhinovirus / Enterovirus NOT DETECTED NOT DETECTED   Influenza A NOT DETECTED NOT DETECTED   Influenza B NOT DETECTED NOT DETECTED   Parainfluenza Virus 1 NOT DETECTED NOT DETECTED   Parainfluenza Virus 2 NOT DETECTED NOT DETECTED   Parainfluenza Virus 3 NOT DETECTED NOT DETECTED   Parainfluenza Virus 4 NOT DETECTED NOT DETECTED   Respiratory Syncytial Virus NOT DETECTED NOT DETECTED   Bordetella pertussis NOT DETECTED NOT DETECTED   Bordetella Parapertussis NOT DETECTED NOT DETECTED   Chlamydophila pneumoniae NOT DETECTED NOT DETECTED   Mycoplasma pneumoniae NOT DETECTED NOT DETECTED   --/--/O POS (04/17 2141)  Imaging:  No results found.  MAU Course/MDM: I have reviewed the triage vital signs and the nursing notes.   Pertinent labs & imaging results that were available during my care of the  patient were reviewed by me and considered in my medical decision making (see chart for details).      I have reviewed her medical records including past results, notes and treatments.   I have reviewed lab results.  Reviewed probable viral upper respiratory virus Discussed siince she is already on amoxicillin, this would cover strep, no injection needed  Treatments in MAU included Sudafed and Mucinex.    Assessment: Sore throat Upper Respiratory infection Cough  Plan: Discharge home Rx Mucinex for cough Rx Sudafed for congestion Continue other plan as per this morning Finisn antibiotic Follow up in Office for prenatal visits  Encouraged to return if she develops worsening of symptoms, increase in pain, fever, or other concerning  symptoms.  Pt stable at time of discharge.  Hansel Feinstein CNM, MSN Certified Nurse-Midwife 02/10/2022 11:06 PM

## 2022-02-10 NOTE — MAU Note (Addendum)
..  Leslie Mayer is a 30 y.o. at [redacted]w[redacted]d here in MAU reporting: Shortness of breath and a bad cough, feels like her throat is closing up. Sore throat. Headache. N/V.    Has been taking amoxicillin for the past 3 days for an infected wisdom tooth removal. Reports that yesterday she noticed a bump below her right earlobe, that hurts when she touches it.   Denies vaginal bleeding, abdominal pain, or abnormal vaginal discharge.  Onset of complaint: 8pm  Pain score: 8/10 throat. 9/10 headache Vitals:   02/10/22 0316  BP: 126/74  Pulse: (!) 111  Resp: 17  Temp: 98.4 F (36.9 C)  SpO2: 100%     FHT:145 Lab orders placed from triage: none

## 2022-02-10 NOTE — MAU Note (Addendum)
.  Leslie Mayer is a 31 y.o. at [redacted]w[redacted]d here in MAU reporting extreme sore throat. Was seen here this am and swabbed for covid and Strept Throat. Covid negative. Pt's throat is very sore and difficult to swallow. On Amoxicillin for 3 days for a bad tooth that is being removed on 5/30. When I cough I feel like I am gonna pass out. No one at home sick LMP: na Onset of complaint: yesterday Pain score: 10 Vitals:   02/10/22 2154  BP: 135/74  Pulse: (!) 122  Resp: (!) 22  Temp: 100.2 F (37.9 C)  SpO2: 97%     FHT:174 Lab orders placed from triage:  none

## 2022-02-10 NOTE — Discharge Instructions (Signed)

## 2022-02-10 NOTE — MAU Provider Note (Signed)
Chief Complaint:  Shortness of Breath  HPI: Leslie Mayer is a 31 y.o. G3P2002 at [redacted]w[redacted]d who presents to maternity admissions reporting cough with shortness of breath when she's coughing, sore throat, headache and nausea/vomiting when she coughs too hard. These symptoms began yesterday, she noticed a cough, took a nap hoping that would help and woke up feeling worse. Is currently on amoxicillin (x3 days) for an infected wisdom tooth that needs removal. Also noticed a painful lump below her right earlobe that hurts when she touches it but will get that looked at by an urgent care or PCP. Wants to make sure she does not have strep/flu/Covid and to know what she can take for her symptoms. Denies vaginal bleeding or discharge.  Pregnancy Course: Needs to establish routine prenatal care  Past Medical History:  Diagnosis Date   Asthma    prn inhaler   GERD (gastroesophageal reflux disease)    no current med.   Obesity 11/23/2012   Pyogenic granuloma 04/2015   left long finger   OB History  Gravida Para Term Preterm AB Living  3 2 2  0 0 2  SAB IAB Ectopic Multiple Live Births  0 0 0 0 2    # Outcome Date GA Lbr Len/2nd Weight Sex Delivery Anes PTL Lv  3 Current           2 Term 04/17/15 [redacted]w[redacted]d 15:05 / 01:04 10 lb 12 oz (4.876 kg) F Vag-Spont EPI  LIV  1 Term 05/19/13 [redacted]w[redacted]d 21:24 / 01:28 8 lb 14.9 oz (4.051 kg) F Vag-Spont EPI  LIV   Past Surgical History:  Procedure Laterality Date   HAND TUMOR EXCISION     MASS EXCISION Left 05/02/2015   Procedure: EXCISION MASS LEFT LONG FINGER;  Surgeon: 07/02/2015, MD;  Location: [facility];  Service: Orthopedics;  Laterality: Left;   NO PAST SURGERIES     Family History  Problem Relation Age of Onset   Diabetes Mother    Sleep apnea Father    Hypertension Paternal Grandmother    Heart disease Paternal Grandmother    Social History   Tobacco Use   Smoking status: Former    Packs/day: 0.00    Types: Cigarettes    Quit date:  09/20/2014    Years since quitting: 7.3   Smokeless tobacco: Never  Substance Use Topics   Alcohol use: No   Drug use: No   Allergies  Allergen Reactions   Keflex [Cephalexin] Other (See Comments)    SKIN PEELED FROM HANDS   No medications prior to admission.   I have reviewed patient's Past Medical Hx, Surgical Hx, Family Hx, Social Hx, medications and allergies.   ROS:  Review of Systems  Constitutional:  Positive for fatigue. Negative for fever.  HENT:  Positive for postnasal drip and sore throat. Negative for congestion, rhinorrhea and sinus pain.   Respiratory:  Positive for cough and shortness of breath. Negative for wheezing.   Gastrointestinal:  Positive for nausea and vomiting. Negative for abdominal pain.  Genitourinary:  Negative for dysuria, vaginal bleeding and vaginal discharge.  Neurological:  Positive for headaches. Negative for syncope and light-headedness.   Physical Exam  Patient Vitals for the past 24 hrs:  BP Temp Temp src Pulse Resp SpO2 Height Weight  02/10/22 0316 126/74 98.4 F (36.9 C) Oral (!) 111 17 100 % 5\' 4"  (1.626 m) 274 lb 3.2 oz (124.4 kg)   Physical Exam Constitutional:      General:  She is not in acute distress.    Appearance: She is well-developed. She is obese. She is ill-appearing.  HENT:     Head: Normocephalic and atraumatic.     Mouth/Throat:     Mouth: Mucous membranes are moist.     Pharynx: Oropharynx is clear.  Eyes:     Pupils: Pupils are equal, round, and reactive to light.  Cardiovascular:     Rate and Rhythm: Regular rhythm. Tachycardia present.  Pulmonary:     Effort: Pulmonary effort is normal. No tachypnea.     Breath sounds: Normal breath sounds.     Comments: Cough sounds wet but non-productive, akin to post-nasal drip, pt states she feels like "stuff is draining down the back of my throat".  Skin:    General: Skin is warm.     Capillary Refill: Capillary refill takes less than 2 seconds.  Neurological:      Mental Status: She is alert and oriented to person, place, and time.  Psychiatric:        Mood and Affect: Mood normal.        Behavior: Behavior normal.    FHR: 145   Labs: Results for orders placed or performed during the hospital encounter of 02/10/22 (from the past 24 hour(s))  Respiratory (~20 pathogens) panel by PCR     Status: None   Collection Time: 02/10/22  4:08 AM   Specimen: Nasopharyngeal Swab; Respiratory  Result Value Ref Range   Adenovirus NOT DETECTED NOT DETECTED   Coronavirus 229E NOT DETECTED NOT DETECTED   Coronavirus HKU1 NOT DETECTED NOT DETECTED   Coronavirus NL63 NOT DETECTED NOT DETECTED   Coronavirus OC43 NOT DETECTED NOT DETECTED   Metapneumovirus NOT DETECTED NOT DETECTED   Rhinovirus / Enterovirus NOT DETECTED NOT DETECTED   Influenza A NOT DETECTED NOT DETECTED   Influenza B NOT DETECTED NOT DETECTED   Parainfluenza Virus 1 NOT DETECTED NOT DETECTED   Parainfluenza Virus 2 NOT DETECTED NOT DETECTED   Parainfluenza Virus 3 NOT DETECTED NOT DETECTED   Parainfluenza Virus 4 NOT DETECTED NOT DETECTED   Respiratory Syncytial Virus NOT DETECTED NOT DETECTED   Bordetella pertussis NOT DETECTED NOT DETECTED   Bordetella Parapertussis NOT DETECTED NOT DETECTED   Chlamydophila pneumoniae NOT DETECTED NOT DETECTED   Mycoplasma pneumoniae NOT DETECTED NOT DETECTED   Imaging:  No results found.  MAU Course: Orders Placed This Encounter  Procedures   Respiratory (~20 pathogens) panel by PCR   Culture, group A strep   Discharge patient   Meds ordered this encounter  Medications   ondansetron (ZOFRAN-ODT) disintegrating tablet 8 mg   benzonatate (TESSALON) capsule 200 mg   benzonatate (TESSALON PERLES) 100 MG capsule    Sig: Take 2 capsules (200 mg total) by mouth 2 (two) times daily as needed for cough.    Dispense:  20 capsule    Refill:  0    Order Specific Question:   Supervising Provider    Answer:   Samara SnidePRATT, TANYA S [[id number]]   MDM: Zofran and  tessalon perles given for cough with vomiting, which helped considerably. Viral panel negative, strep culture pending (unlikely given pt is already on amoxicillin for tooth. Advised safe meds for post-nasal drip and sent prescription for tessalon perles.  Assessment: 1. Throat soreness   2. Acute cough   3. Nausea/vomiting in pregnancy   4. [redacted] weeks gestation of pregnancy    Plan: Discharge home in stable condition with return precautions.    Follow-up  Information     [facility] [employer] [facility]. Schedule an appointment as soon as possible for a visit.   Specialty: Obstetrics and Gynecology Contact information: [address] [place] [address] [phone]                Allergies as of 02/10/2022       Reactions   Keflex [cephalexin] Other (See Comments)   SKIN PEELED FROM HANDS        Medication List     TAKE these medications    acetaminophen-codeine 300-30 MG tablet Commonly known as: TYLENOL #3 Take by mouth every 4 (four) hours as needed for moderate pain.   amoxicillin 500 MG tablet Commonly known as: AMOXIL Take 500 mg by mouth 2 (two) times daily.   benzonatate 100 MG capsule Commonly known as: Tessalon Perles Take 2 capsules (200 mg total) by mouth 2 (two) times daily as needed for cough.        Edd Arbour, CNM, MSN, IBCLC Certified Nurse Midwife, [facility]

## 2022-02-11 DIAGNOSIS — J069 Acute upper respiratory infection, unspecified: Secondary | ICD-10-CM

## 2022-02-11 DIAGNOSIS — J029 Acute pharyngitis, unspecified: Secondary | ICD-10-CM

## 2022-02-11 DIAGNOSIS — Z3A15 15 weeks gestation of pregnancy: Secondary | ICD-10-CM

## 2022-02-11 DIAGNOSIS — R051 Acute cough: Secondary | ICD-10-CM

## 2022-02-11 MED ORDER — PSEUDOEPHEDRINE HCL 30 MG PO TABS
30.0000 mg | ORAL_TABLET | ORAL | 0 refills | Status: DC | PRN
Start: 2022-02-11 — End: 2022-06-04

## 2022-02-11 MED ORDER — GUAIFENESIN ER 600 MG PO TB12: 600.0000 mg | ORAL_TABLET | Freq: Two times a day (BID) | ORAL | 1 refills | Status: DC

## 2022-02-11 NOTE — Progress Notes (Signed)
WRitten and verbal d/c instructions given and understanding voiced.  

## 2022-02-13 LAB — CULTURE, GROUP A STREP (THRC)

## 2022-04-06 LAB — OB RESULTS CONSOLE ANTIBODY SCREEN: Antibody Screen: NEGATIVE

## 2022-04-06 LAB — OB RESULTS CONSOLE HIV ANTIBODY (ROUTINE TESTING): HIV: NONREACTIVE

## 2022-04-06 LAB — OB RESULTS CONSOLE GC/CHLAMYDIA
Chlamydia: NEGATIVE
Neisseria Gonorrhea: NEGATIVE

## 2022-04-06 LAB — OB RESULTS CONSOLE RUBELLA ANTIBODY, IGM: Rubella: IMMUNE

## 2022-04-06 LAB — OB RESULTS CONSOLE HEPATITIS B SURFACE ANTIGEN: Hepatitis B Surface Ag: NEGATIVE

## 2022-04-06 LAB — OB RESULTS CONSOLE ABO/RH: RH Type: POSITIVE

## 2022-04-06 LAB — HEPATITIS C ANTIBODY: HCV Ab: NEGATIVE

## 2022-04-06 LAB — OB RESULTS CONSOLE RPR: RPR: NONREACTIVE

## 2022-04-11 ENCOUNTER — Other Ambulatory Visit: Payer: Self-pay

## 2022-04-20 ENCOUNTER — Other Ambulatory Visit: Payer: Self-pay | Admitting: Obstetrics and Gynecology

## 2022-04-20 ENCOUNTER — Other Ambulatory Visit: Payer: Self-pay

## 2022-04-20 DIAGNOSIS — Z3689 Encounter for other specified antenatal screening: Secondary | ICD-10-CM

## 2022-05-08 ENCOUNTER — Other Ambulatory Visit: Payer: Self-pay | Admitting: *Deleted

## 2022-05-08 ENCOUNTER — Encounter: Payer: Self-pay | Admitting: *Deleted

## 2022-05-08 ENCOUNTER — Ambulatory Visit: Payer: Medicaid Other | Attending: Obstetrics and Gynecology

## 2022-05-08 ENCOUNTER — Ambulatory Visit: Payer: Medicaid Other | Admitting: *Deleted

## 2022-05-08 VITALS — BP 120/63 | HR 102

## 2022-05-08 DIAGNOSIS — Z6841 Body Mass Index (BMI) 40.0 and over, adult: Secondary | ICD-10-CM

## 2022-05-08 DIAGNOSIS — Z3689 Encounter for other specified antenatal screening: Secondary | ICD-10-CM | POA: Diagnosis not present

## 2022-05-08 DIAGNOSIS — O99213 Obesity complicating pregnancy, third trimester: Secondary | ICD-10-CM

## 2022-06-04 ENCOUNTER — Ambulatory Visit: Payer: Medicaid Other | Admitting: *Deleted

## 2022-06-04 ENCOUNTER — Ambulatory Visit: Payer: Medicaid Other | Attending: Maternal & Fetal Medicine

## 2022-06-04 ENCOUNTER — Other Ambulatory Visit: Payer: Self-pay | Admitting: *Deleted

## 2022-06-04 VITALS — BP 111/58 | HR 108

## 2022-06-04 DIAGNOSIS — E669 Obesity, unspecified: Secondary | ICD-10-CM | POA: Diagnosis not present

## 2022-06-04 DIAGNOSIS — O99213 Obesity complicating pregnancy, third trimester: Secondary | ICD-10-CM | POA: Insufficient documentation

## 2022-06-04 DIAGNOSIS — F17200 Nicotine dependence, unspecified, uncomplicated: Secondary | ICD-10-CM

## 2022-06-04 DIAGNOSIS — O99333 Smoking (tobacco) complicating pregnancy, third trimester: Secondary | ICD-10-CM

## 2022-06-04 DIAGNOSIS — Z3A31 31 weeks gestation of pregnancy: Secondary | ICD-10-CM

## 2022-06-22 ENCOUNTER — Ambulatory Visit: Payer: Medicaid Other | Attending: Maternal & Fetal Medicine

## 2022-06-22 ENCOUNTER — Ambulatory Visit: Payer: Medicaid Other | Admitting: *Deleted

## 2022-06-22 VITALS — BP 112/54 | HR 108

## 2022-06-22 DIAGNOSIS — O99213 Obesity complicating pregnancy, third trimester: Secondary | ICD-10-CM

## 2022-06-29 ENCOUNTER — Ambulatory Visit: Payer: Medicaid Other | Admitting: *Deleted

## 2022-06-29 ENCOUNTER — Ambulatory Visit: Payer: Medicaid Other | Attending: Maternal & Fetal Medicine

## 2022-06-29 VITALS — BP 127/59 | HR 111

## 2022-06-29 DIAGNOSIS — O99213 Obesity complicating pregnancy, third trimester: Secondary | ICD-10-CM | POA: Diagnosis present

## 2022-07-01 LAB — OB RESULTS CONSOLE GBS: GBS: NEGATIVE

## 2022-07-06 ENCOUNTER — Other Ambulatory Visit: Payer: Self-pay | Admitting: *Deleted

## 2022-07-06 ENCOUNTER — Encounter: Payer: Self-pay | Admitting: *Deleted

## 2022-07-06 ENCOUNTER — Ambulatory Visit: Payer: Medicaid Other | Attending: Maternal & Fetal Medicine

## 2022-07-06 ENCOUNTER — Ambulatory Visit: Payer: Medicaid Other | Admitting: *Deleted

## 2022-07-06 DIAGNOSIS — O99333 Smoking (tobacco) complicating pregnancy, third trimester: Secondary | ICD-10-CM

## 2022-07-06 DIAGNOSIS — O99213 Obesity complicating pregnancy, third trimester: Secondary | ICD-10-CM | POA: Insufficient documentation

## 2022-07-06 DIAGNOSIS — O3660X0 Maternal care for excessive fetal growth, unspecified trimester, not applicable or unspecified: Secondary | ICD-10-CM | POA: Diagnosis not present

## 2022-07-06 DIAGNOSIS — Z3A36 36 weeks gestation of pregnancy: Secondary | ICD-10-CM

## 2022-07-06 DIAGNOSIS — F17210 Nicotine dependence, cigarettes, uncomplicated: Secondary | ICD-10-CM

## 2022-07-06 DIAGNOSIS — Z720 Tobacco use: Secondary | ICD-10-CM

## 2022-07-08 ENCOUNTER — Other Ambulatory Visit: Payer: Self-pay | Admitting: Obstetrics and Gynecology

## 2022-07-13 ENCOUNTER — Ambulatory Visit: Payer: Medicaid Other | Admitting: *Deleted

## 2022-07-13 ENCOUNTER — Ambulatory Visit: Payer: Medicaid Other | Attending: Maternal & Fetal Medicine

## 2022-07-13 VITALS — BP 122/63 | HR 97

## 2022-07-13 DIAGNOSIS — O99213 Obesity complicating pregnancy, third trimester: Secondary | ICD-10-CM | POA: Insufficient documentation

## 2022-07-14 ENCOUNTER — Telehealth (HOSPITAL_COMMUNITY): Payer: Self-pay | Admitting: *Deleted

## 2022-07-14 NOTE — Telephone Encounter (Signed)
Preadmission screen  

## 2022-07-15 ENCOUNTER — Encounter (HOSPITAL_COMMUNITY): Payer: Self-pay | Admitting: *Deleted

## 2022-07-20 ENCOUNTER — Ambulatory Visit: Payer: Medicaid Other | Attending: Maternal & Fetal Medicine

## 2022-07-20 ENCOUNTER — Telehealth (HOSPITAL_COMMUNITY): Payer: Self-pay

## 2022-07-20 ENCOUNTER — Ambulatory Visit: Payer: Medicaid Other | Admitting: *Deleted

## 2022-07-20 VITALS — BP 121/63 | HR 99

## 2022-07-20 DIAGNOSIS — O3663X0 Maternal care for excessive fetal growth, third trimester, not applicable or unspecified: Secondary | ICD-10-CM

## 2022-07-20 DIAGNOSIS — O99213 Obesity complicating pregnancy, third trimester: Secondary | ICD-10-CM | POA: Diagnosis present

## 2022-07-20 DIAGNOSIS — Z720 Tobacco use: Secondary | ICD-10-CM | POA: Diagnosis present

## 2022-07-20 NOTE — Telephone Encounter (Signed)
Preadmission screening 

## 2022-07-24 ENCOUNTER — Encounter (HOSPITAL_COMMUNITY): Payer: Self-pay | Admitting: Obstetrics and Gynecology

## 2022-07-24 ENCOUNTER — Inpatient Hospital Stay (HOSPITAL_COMMUNITY): Payer: Medicaid Other | Admitting: Anesthesiology

## 2022-07-24 ENCOUNTER — Inpatient Hospital Stay (HOSPITAL_COMMUNITY)
Admission: AD | Admit: 2022-07-24 | Discharge: 2022-07-26 | DRG: 768 | Disposition: A | Discharge status: Home / Self Care | Payer: Medicaid Other | Attending: Obstetrics and Gynecology | Admitting: Obstetrics and Gynecology

## 2022-07-24 ENCOUNTER — Inpatient Hospital Stay (HOSPITAL_COMMUNITY): Payer: Medicaid Other

## 2022-07-24 ENCOUNTER — Other Ambulatory Visit: Payer: Self-pay

## 2022-07-24 DIAGNOSIS — Z8614 Personal history of Methicillin resistant Staphylococcus aureus infection: Secondary | ICD-10-CM | POA: Diagnosis present

## 2022-07-24 DIAGNOSIS — O9112 Abscess of breast associated with the puerperium: Secondary | ICD-10-CM | POA: Diagnosis not present

## 2022-07-24 DIAGNOSIS — O358XX0 Maternal care for other (suspected) fetal abnormality and damage, not applicable or unspecified: Secondary | ICD-10-CM | POA: Diagnosis not present

## 2022-07-24 DIAGNOSIS — J45909 Unspecified asthma, uncomplicated: Secondary | ICD-10-CM | POA: Diagnosis present

## 2022-07-24 DIAGNOSIS — Z3A39 39 weeks gestation of pregnancy: Secondary | ICD-10-CM

## 2022-07-24 DIAGNOSIS — Z825 Family history of asthma and other chronic lower respiratory diseases: Secondary | ICD-10-CM

## 2022-07-24 DIAGNOSIS — Z3490 Encounter for supervision of normal pregnancy, unspecified, unspecified trimester: Secondary | ICD-10-CM | POA: Diagnosis present

## 2022-07-24 DIAGNOSIS — Z881 Allergy status to other antibiotic agents status: Secondary | ICD-10-CM | POA: Diagnosis not present

## 2022-07-24 DIAGNOSIS — O3663X0 Maternal care for excessive fetal growth, third trimester, not applicable or unspecified: Secondary | ICD-10-CM | POA: Diagnosis present

## 2022-07-24 DIAGNOSIS — Z833 Family history of diabetes mellitus: Secondary | ICD-10-CM | POA: Diagnosis not present

## 2022-07-24 DIAGNOSIS — D62 Acute posthemorrhagic anemia: Secondary | ICD-10-CM | POA: Diagnosis not present

## 2022-07-24 DIAGNOSIS — O99214 Obesity complicating childbirth: Secondary | ICD-10-CM | POA: Diagnosis present

## 2022-07-24 DIAGNOSIS — O9952 Diseases of the respiratory system complicating childbirth: Secondary | ICD-10-CM | POA: Diagnosis present

## 2022-07-24 DIAGNOSIS — O91119 Abscess of breast associated with pregnancy, unspecified trimester: Secondary | ICD-10-CM | POA: Diagnosis present

## 2022-07-24 DIAGNOSIS — O326XX0 Maternal care for compound presentation, not applicable or unspecified: Secondary | ICD-10-CM | POA: Diagnosis present

## 2022-07-24 DIAGNOSIS — O9081 Anemia of the puerperium: Secondary | ICD-10-CM | POA: Diagnosis not present

## 2022-07-24 DIAGNOSIS — Z87891 Personal history of nicotine dependence: Secondary | ICD-10-CM

## 2022-07-24 LAB — TYPE AND SCREEN
ABO/RH(D): O POS
Antibody Screen: NEGATIVE

## 2022-07-24 LAB — CBC
HCT: 29.8 % — ABNORMAL LOW (ref 36.0–46.0)
Hemoglobin: 10 g/dL — ABNORMAL LOW (ref 12.0–15.0)
MCH: 26.5 pg (ref 26.0–34.0)
MCHC: 33.6 g/dL (ref 30.0–36.0)
MCV: 79 fL — ABNORMAL LOW (ref 80.0–100.0)
Platelets: 349 10*3/uL (ref 150–400)
RBC: 3.77 MIL/uL — ABNORMAL LOW (ref 3.87–5.11)
RDW: 18.9 % — ABNORMAL HIGH (ref 11.5–15.5)
WBC: 13.8 10*3/uL — ABNORMAL HIGH (ref 4.0–10.5)
nRBC: 0 % (ref 0.0–0.2)

## 2022-07-24 LAB — RPR: RPR Ser Ql: NONREACTIVE

## 2022-07-24 MED ORDER — TERBUTALINE SULFATE 1 MG/ML IJ SOLN
0.2500 mg | Freq: Once | INTRAMUSCULAR | Status: AC | PRN
  Administered 2022-07-24: 0.25 mg via SUBCUTANEOUS
  Filled 2022-07-24: qty 1

## 2022-07-24 MED ORDER — LIDOCAINE-EPINEPHRINE (PF) 2 %-1:200000 IJ SOLN
INTRAMUSCULAR | Status: DC | PRN
  Administered 2022-07-24: 5 mL via EPIDURAL

## 2022-07-24 MED ORDER — LACTATED RINGERS IV SOLN
500.0000 mL | INTRAVENOUS | Status: DC | PRN
  Administered 2022-07-24 (×2): 500 mL via INTRAVENOUS

## 2022-07-24 MED ORDER — LACTATED RINGERS IV SOLN
INTRAVENOUS | Status: DC
  Administered 2022-07-24: 125 mL/h via INTRAVENOUS

## 2022-07-24 MED ORDER — MISOPROSTOL 50MCG HALF TABLET
50.0000 ug | ORAL_TABLET | ORAL | Status: DC
  Administered 2022-07-24: 50 ug via BUCCAL
  Filled 2022-07-24 (×3): qty 1

## 2022-07-24 MED ORDER — EPHEDRINE 5 MG/ML INJ: 10.0000 mg | INTRAVENOUS | Status: DC | PRN

## 2022-07-24 MED ORDER — PHENYLEPHRINE 80 MCG/ML (10ML) SYRINGE FOR IV PUSH (FOR BLOOD PRESSURE SUPPORT)
80.0000 ug | PREFILLED_SYRINGE | INTRAVENOUS | Status: DC | PRN
  Administered 2022-07-24 (×2): 80 ug via INTRAVENOUS

## 2022-07-24 MED ORDER — PHENYLEPHRINE 80 MCG/ML (10ML) SYRINGE FOR IV PUSH (FOR BLOOD PRESSURE SUPPORT)
80.0000 ug | PREFILLED_SYRINGE | INTRAVENOUS | Status: DC | PRN
  Filled 2022-07-24: qty 10

## 2022-07-24 MED ORDER — LIDOCAINE HCL (PF) 1 % IJ SOLN: 30.0000 mL | INTRAMUSCULAR | Status: DC | PRN

## 2022-07-24 MED ORDER — ACETAMINOPHEN 325 MG PO TABS: 650.0000 mg | ORAL_TABLET | ORAL | Status: DC | PRN

## 2022-07-24 MED ORDER — OXYTOCIN-SODIUM CHLORIDE 30-0.9 UT/500ML-% IV SOLN
1.0000 m[IU]/min | INTRAVENOUS | Status: DC
  Administered 2022-07-24: 2 m[IU]/min via INTRAVENOUS
  Filled 2022-07-24: qty 500

## 2022-07-24 MED ORDER — FENTANYL CITRATE (PF) 100 MCG/2ML IJ SOLN
100.0000 ug | INTRAMUSCULAR | Status: DC | PRN
  Administered 2022-07-24 (×2): 100 ug via INTRAVENOUS
  Filled 2022-07-24 (×2): qty 2

## 2022-07-24 MED ORDER — MISOPROSTOL 25 MCG QUARTER TABLET
25.0000 ug | ORAL_TABLET | Freq: Once | ORAL | Status: AC
  Administered 2022-07-24: 25 ug via BUCCAL
  Filled 2022-07-24: qty 1

## 2022-07-24 MED ORDER — ONDANSETRON HCL 4 MG/2ML IJ SOLN: 4.0000 mg | Freq: Four times a day (QID) | INTRAMUSCULAR | Status: DC | PRN

## 2022-07-24 MED ORDER — OXYTOCIN-SODIUM CHLORIDE 30-0.9 UT/500ML-% IV SOLN: 2.5000 [IU]/h | INTRAVENOUS | Status: DC

## 2022-07-24 MED ORDER — FENTANYL-BUPIVACAINE-NACL 0.5-0.125-0.9 MG/250ML-% EP SOLN
12.0000 mL/h | EPIDURAL | Status: DC | PRN
  Administered 2022-07-24: 12 mL/h via EPIDURAL
  Filled 2022-07-24: qty 250

## 2022-07-24 MED ORDER — FENTANYL CITRATE (PF) 100 MCG/2ML IJ SOLN
INTRAMUSCULAR | Status: AC
  Filled 2022-07-24: qty 2

## 2022-07-24 MED ORDER — LACTATED RINGERS IV SOLN
500.0000 mL | Freq: Once | INTRAVENOUS | Status: AC
  Administered 2022-07-25: 500 mL via INTRAVENOUS

## 2022-07-24 MED ORDER — MISOPROSTOL 50MCG HALF TABLET
50.0000 ug | ORAL_TABLET | Freq: Once | ORAL | Status: AC
  Administered 2022-07-24: 50 ug via VAGINAL
  Filled 2022-07-24: qty 1

## 2022-07-24 MED ORDER — SOD CITRATE-CITRIC ACID 500-334 MG/5ML PO SOLN: 30.0000 mL | ORAL | Status: DC | PRN

## 2022-07-24 MED ORDER — DIPHENHYDRAMINE HCL 50 MG/ML IJ SOLN: 12.5000 mg | INTRAMUSCULAR | Status: DC | PRN

## 2022-07-24 MED ORDER — OXYTOCIN BOLUS FROM INFUSION
333.0000 mL | Freq: Once | INTRAVENOUS | Status: AC
  Administered 2022-07-25: 333 mL via INTRAVENOUS

## 2022-07-24 NOTE — Progress Notes (Signed)
Subjective:    Feeling well, mild cramping. Agrees to SVE.   Objective:    VS: BP 124/75   Pulse 94   Temp 97.7 F (36.5 C) (Axillary)   Resp 20   Ht 5\' 4"  (1.626 m)   Wt (!) 137.6 kg   LMP 10/24/2021 (Approximate)   BMI 52.06 kg/m  FHR : baseline 135 / variability moderate / accelerations present / absent decelerations Toco: contractions every 2 minutes  Membranes: intact Dilation: 1 Effacement (%): 50 Cervical Position: Posterior Station: Ballotable Presentation: Vertex Exam by:: Clois Dupes, CNM Pitocin 2 mU/min  Assessment/Plan:   31 y.o. [id number] [redacted]w[redacted]d IOL for prev LGA >10# baby Maternal obesity  Labor:  latent phase, S/P Cytotec x2 doses, low-dose Pitocin now, max of 6 mU/min. Unable to place a cervical balloon.  Fetal Wellbeing:  Category I Pain Control:  IV pain meds I/D:   SVD Anticipated MOD:  NSVD   Arrie Eastern DNP, CNM 07/24/2022 8:28 PM

## 2022-07-24 NOTE — H&P (Signed)
OB ADMISSION/ HISTORY & PHYSICAL:  Admission Date: 07/24/2022  7:31 AM  Admit Diagnosis: Maternal Obesity  Leslie Mayer is a 31 y.o. female G61P2002 [redacted]w[redacted]d presenting for IOL for hx of LGA with largest newborn @ 10# and maternal obesity with pre-pregnancy BMI >30, current BMI 52. Endorses active FM, denies LOF and vaginal bleeding.   History of current pregnancy: [id number]   Patient entered care with [facility] at 23+3 wks.   EDC 07/31/22 by [place] Antenatal testing: for BMI started at 34 weeks Last evaluation: 36+3  wks vertex/ posterior placenta/ AFI 21/ EFW 7# 8oz (91%)/ BPP 8/8   Significant prenatal events:  Patient Active Problem List   Diagnosis Date Noted   LGA (large for gestational age) infant 07/24/2022   Hx MRSA infection 07/24/2022   Morbid obesity (HCC) 04/17/2015   Allergy to cephalosporin--keflex 04/17/2015   Asthma 11/23/2012    Prenatal Labs: ABO, Rh: --/--/O POS (11/03 0754) Antibody: NEG (11/03 0754) Rubella: Immune (07/17 0000)  RPR: Nonreactive (07/17 0000)  HBsAg: Negative (07/17 0000)  HIV: Non-reactive (07/17 0000)  GTT: normal 3hr  GBS: Negative/-- (10/11 0000)  GC/CHL: neg/neg Genetics: low-risk, normal [place]ama Vaccines: Tdap: current this preg Influenza: declined   OB History  Gravida Para Term Preterm AB Living  3 2 2  0 0 2  SAB IAB Ectopic Multiple Live Births  0 0 0 0 2    # Outcome Date GA Lbr Len/2nd Weight Sex Delivery Anes PTL Lv  3 Current           2 Term 04/17/15 [redacted]w[redacted]d 15:05 / 01:04 4876 g F Vag-Spont EPI  LIV  1 Term 05/19/13 [redacted]w[redacted]d 21:24 / 01:28 4051 g F Vag-Spont EPI  LIV    Medical / Surgical History: Past medical history:  Past Medical History:  Diagnosis Date   Asthma    prn inhaler   GERD (gastroesophageal reflux disease)    no current med.   Obesity 11/23/2012   Pyogenic granuloma 04/22/2015   left long finger    Past surgical history:  Past Surgical History:  Procedure Laterality Date   HAND TUMOR EXCISION      MASS EXCISION Left 05/02/2015   Procedure: EXCISION MASS LEFT LONG FINGER;  Surgeon: 07/02/2015, MD;  Location: [facility];  Service: Orthopedics;  Laterality: Left;   NO PAST SURGERIES     Family History:  Family History  Problem Relation Age of Onset   Diabetes Mother    Sleep apnea Father    Asthma Sister    Cancer Maternal Grandfather    Hypertension Paternal Grandmother    Heart disease Paternal Grandmother    Stroke Neg Hx     Social History:  reports that she quit smoking about 7 years ago. Her smoking use included cigarettes. She has never used smokeless tobacco. She reports that she does not drink alcohol and does not use drugs.  Allergies: Keflex [cephalexin]   Current Medications at time of admission:  Prior to Admission medications   Medication Sig Start Date End Date Taking? Authorizing Provider  ferrous sulfate 325 (65 FE) MG tablet Take 325 mg by mouth daily with breakfast. Patient not taking: Reported on 06/04/2022    [provider]  Prenatal Vit-Fe Fumarate-FA (PRENATAL MULTIVITAMIN) TABS tablet Take 1 tablet by mouth daily at 12 noon.    [provider]    Review of Systems: Constitutional: Negative   HENT: Negative   Eyes: Negative   Respiratory: Negative  Cardiovascular: Negative   Gastrointestinal: Negative  Genitourinary: neg for bloody show, neg for LOF   Musculoskeletal: Negative   Skin: Negative   Neurological: Negative   Endo/Heme/Allergies: Negative   Psychiatric/Behavioral: Negative    Physical Exam: VS: Blood pressure 111/60, pulse 100, temperature 97.9 F (36.6 C), temperature source Oral, resp. rate 20, height 5\' 4"  (1.626 m), weight (!) 137.6 kg, last menstrual period 10/24/2021. AAO x3, no signs of distress Cardiovascular: RRR Respiratory: Unlabored GU/GI: Abdomen gravid, non-tender, non-distended, active FM, vertex, EFW 8+ per Leopold's Extremities: trace edema, negative for pain, tenderness, and  cords  Cervical exam:  FT/ thick/ high presentation confirmed by bedside US FHR: baseline rate 135 / variability moderate / accelerations present / absent decelerations TOCO: none per TOCO    Prenatal Transfer Tool  Maternal Diabetes: No Genetic Screening: Normal Maternal Ultrasounds/Referrals: Normal Fetal Ultrasounds or other Referrals:  None, Referred to Materal Fetal Medicine  Maternal Substance Abuse:  No Significant Maternal Medications:  None Significant Maternal Lab Results: Group B Strep negative Number of Prenatal Visits:greater than 3 verified prenatal visits Other Comments:  None    Assessment: 31 y.o. [id number] [redacted]w[redacted]d IOL for hx of LGA and maternal obesity  FHR category 1 GBS meg Pain management plan: pt to decide   Plan:  Admit to L&D Routine admission orders Epidural PRN Cytotec for ripening and Pitocin for induction PRN Dr Charlesetta Garibaldi notified of admission and plan of care  Arrie Eastern DNP, CNM 07/24/2022 9:14 AM

## 2022-07-24 NOTE — Progress Notes (Signed)
Subjective:    Doing well, resting. Discussed repeating Cytotec for further ripening. Pt tolerates vaginal exam, but would prefer oral dosing.   Objective:    VS: BP 128/72   Pulse (!) 106   Temp 98.1 F (36.7 C) (Oral)   Resp 20   Ht 5\' 4"  (1.626 m)   Wt (!) 137.6 kg   LMP 10/24/2021 (Approximate)   BMI 52.06 kg/m  FHR : baseline 145 / variability moderate / accelerations present / absent decelerations Toco: irregular Membranes: intact Dilation: 1 Effacement (%): Thick Cervical Position: Posterior Station: Ballotable Presentation: Vertex Exam by:: CNM Clois Dupes   Assessment/Plan:   31 y.o. G2I9485 [redacted]w[redacted]d IOL for prev LGA >10# baby Maternal obesity  Labor:  Cytotec x1, buccal and oral. Repeat oral dose of 50 mcg now Fetal Wellbeing:  Category I prolonged decel resolved with intrauterine resuscitation and Terbutaline 0.25 [place] Pain Control:   unmedicated, aware of options I/D:   GBS neg Anticipated MOD:  NSVD  Arrie Eastern DNP, CNM 07/24/2022 2:10 PM

## 2022-07-24 NOTE — Anesthesia Procedure Notes (Signed)

## 2022-07-24 NOTE — Anesthesia Preprocedure Evaluation (Signed)
Anesthesia Evaluation  Patient identified by MRN, date of birth, ID band Patient awake    Reviewed: Allergy & Precautions, NPO status , Patient's Chart, lab work & pertinent test results  Airway Mallampati: III  TM Distance: >3 FB Neck ROM: Full    Dental no notable dental hx.    Pulmonary asthma , former smoker   Pulmonary exam normal breath sounds clear to auscultation       Cardiovascular negative cardio ROS Normal cardiovascular exam Rhythm:Regular Rate:Normal     Neuro/Psych negative neurological ROS  negative psych ROS   GI/Hepatic Neg liver ROS,GERD  ,,  Endo/Other    Morbid obesity (BMI 52)  Renal/GU negative Renal ROS  negative genitourinary   Musculoskeletal negative musculoskeletal ROS (+)    Abdominal   Peds  Hematology  (+) Blood dyscrasia, anemia   Anesthesia Other Findings   Reproductive/Obstetrics (+) Pregnancy                             Anesthesia Physical Anesthesia Plan  ASA: 3  Anesthesia Plan: Epidural   Post-op Pain Management:    Induction:   PONV Risk Score and Plan: Treatment may vary due to age or medical condition  Airway Management Planned: Natural Airway  Additional Equipment:   Intra-op Plan:   Post-operative Plan:   Informed Consent: I have reviewed the patients History and Physical, chart, labs and discussed the procedure including the risks, benefits and alternatives for the proposed anesthesia with the patient or authorized representative who has indicated his/her understanding and acceptance.       Plan Discussed with: Anesthesiologist  Anesthesia Plan Comments: (Patient identified. Risks, benefits, options discussed with patient including but not limited to bleeding, infection, nerve damage, paralysis, failed block, incomplete pain control, headache, blood pressure changes, nausea, vomiting, reactions to medication, itching, and post  partum back pain. Confirmed with bedside nurse the patient's most recent platelet count. Confirmed with the patient that they are not taking any anticoagulation, have any bleeding history or any family history of bleeding disorders. Patient expressed understanding and wishes to proceed. All questions were answered. )       Anesthesia Quick Evaluation

## 2022-07-25 ENCOUNTER — Inpatient Hospital Stay (HOSPITAL_BASED_OUTPATIENT_CLINIC_OR_DEPARTMENT_OTHER): Payer: Medicaid Other

## 2022-07-25 ENCOUNTER — Encounter (HOSPITAL_COMMUNITY): Payer: Self-pay | Admitting: Obstetrics and Gynecology

## 2022-07-25 DIAGNOSIS — Z3A39 39 weeks gestation of pregnancy: Secondary | ICD-10-CM

## 2022-07-25 DIAGNOSIS — O358XX0 Maternal care for other (suspected) fetal abnormality and damage, not applicable or unspecified: Secondary | ICD-10-CM

## 2022-07-25 MED ORDER — SENNOSIDES-DOCUSATE SODIUM 8.6-50 MG PO TABS
2.0000 | ORAL_TABLET | Freq: Every day | ORAL | Status: DC
  Administered 2022-07-26: 2 via ORAL
  Filled 2022-07-25: qty 2

## 2022-07-25 MED ORDER — SIMETHICONE 80 MG PO CHEW: 80.0000 mg | CHEWABLE_TABLET | ORAL | Status: DC | PRN

## 2022-07-25 MED ORDER — DIBUCAINE (PERIANAL) 1 % EX OINT: 1.0000 | TOPICAL_OINTMENT | CUTANEOUS | Status: DC | PRN

## 2022-07-25 MED ORDER — ONDANSETRON HCL 4 MG PO TABS: 4.0000 mg | ORAL_TABLET | ORAL | Status: DC | PRN

## 2022-07-25 MED ORDER — WITCH HAZEL-GLYCERIN EX PADS: 1.0000 | MEDICATED_PAD | CUTANEOUS | Status: DC | PRN

## 2022-07-25 MED ORDER — ONDANSETRON HCL 4 MG/2ML IJ SOLN: 4.0000 mg | INTRAMUSCULAR | Status: DC | PRN

## 2022-07-25 MED ORDER — PRENATAL MULTIVITAMIN CH
1.0000 | ORAL_TABLET | Freq: Every day | ORAL | Status: DC
  Administered 2022-07-26: 1 via ORAL
  Filled 2022-07-25: qty 1

## 2022-07-25 MED ORDER — ACETAMINOPHEN 325 MG PO TABS
650.0000 mg | ORAL_TABLET | ORAL | Status: DC | PRN
  Administered 2022-07-25: 650 mg via ORAL
  Filled 2022-07-25: qty 2

## 2022-07-25 MED ORDER — ZOLPIDEM TARTRATE 5 MG PO TABS: 5.0000 mg | ORAL_TABLET | Freq: Every evening | ORAL | Status: DC | PRN

## 2022-07-25 MED ORDER — OXYCODONE HCL 5 MG PO TABS
5.0000 mg | ORAL_TABLET | Freq: Four times a day (QID) | ORAL | Status: DC | PRN
  Administered 2022-07-25 – 2022-07-26 (×3): 10 mg via ORAL
  Filled 2022-07-25 (×4): qty 2

## 2022-07-25 MED ORDER — COCONUT OIL OIL: 1.0000 | TOPICAL_OIL | Status: DC | PRN

## 2022-07-25 MED ORDER — DIPHENHYDRAMINE HCL 25 MG PO CAPS: 25.0000 mg | ORAL_CAPSULE | Freq: Four times a day (QID) | ORAL | Status: DC | PRN

## 2022-07-25 MED ORDER — IBUPROFEN 600 MG PO TABS
600.0000 mg | ORAL_TABLET | Freq: Four times a day (QID) | ORAL | Status: DC
  Administered 2022-07-25 – 2022-07-26 (×2): 600 mg via ORAL
  Filled 2022-07-25 (×5): qty 1

## 2022-07-25 MED ORDER — BENZOCAINE-MENTHOL 20-0.5 % EX AERO
1.0000 | INHALATION_SPRAY | CUTANEOUS | Status: DC | PRN
  Administered 2022-07-25 – 2022-07-26 (×2): 1 via TOPICAL
  Filled 2022-07-25 (×2): qty 56

## 2022-07-25 NOTE — Lactation Note (Signed)
This note was copied from a baby's chart. Lactation Consultation Note  Patient Name: Leslie Mayer [id number] Date: 07/25/2022 Reason for consult: Initial assessment;Term Age:30 hours RN Lorrin Goodell) will ask Birth Parent if she would like to be seen by [place]  on MBU when doing next assessment on Birth Parent and infant at 35 am. Maternal Data Has patient been taught Hand Expression?: Yes  Feeding Mother's Current Feeding Choice: Breast Milk and Formula  LATCH Score Latch: Grasps breast easily, tongue down, lips flanged, rhythmical sucking.  Audible Swallowing: Spontaneous and intermittent  Type of Nipple: Everted at rest and after stimulation  Comfort (Breast/Nipple): Soft / non-tender  Hold (Positioning): Assistance needed to correctly position infant at breast and maintain latch.  LATCH Score: 9   Lactation Tools Discussed/Used    Interventions Interventions: Breast feeding basics reviewed;Adjust position;Assisted with latch;Support pillows;Skin to skin;Position options;Breast massage;Expressed milk;Education;Pace feeding;LC Services brochure  Discharge Pump: Manual  Consult Status Consult Status: Follow-up Date: 07/26/22    Eulis Canner 07/25/2022, 11:47 PM

## 2022-07-25 NOTE — Lactation Note (Signed)
This note was copied from a baby's chart. Lactation Consultation Note  Patient Name: Boy Larrissa Stivers [id number] Date: 07/25/2022 Reason for consult: L&D Initial assessment Age:30 hours  L&D consult with <60 minutes old infant and P3. Congratulated family on newborn.  Parents state they need some time to talk about feeding goals. Support parent (FOB) expressed concern due to use of cigarettes. LC provided information about breastfeeding and lifestyles.   Discussed STS as ideal transition for infants after birth. Encouraged parents to [facility] services to assist with their feeding goals.  LC shared information with LD RN.  Feeding Mother's Current Feeding Choice: Breast Milk and Formula  Interventions Interventions: Education  Consult Status Consult Status: Follow-up from L&D    Kayceon Oki A Higuera Ancidey 07/25/2022, 12:20 PM

## 2022-07-25 NOTE — Progress Notes (Signed)
Subjective:    Comfortable with epidural. Agrees to VE  Objective:    VS: BP (!) 108/54   Pulse (!) 107   Temp 98.1 F (36.7 C) (Oral)   Resp 16   Ht 5\' 4"  (1.626 m)   Wt (!) 137.6 kg   LMP 10/24/2021 (Approximate)   SpO2 100%   BMI 52.06 kg/m  FHR : baseline 135 / variability moderate / accelerations present / absent decelerations Toco: contractions every 2-3 minutes  Membranes: intact Dilation: 6 Effacement (%): 80 Cervical Position: Middle Station: -3 Presentation: Undeterminable (CNM assessing) Exam by:: Leanord Hawking, CNM Pitocin 4 mU/min  Assessment/Plan:   31 y.o. [id number] [redacted]w[redacted]d IOL for prev LGA >10# baby Maternal obesity  Attempt to AROM, compound presentation palpated, confirmed with bedside US. Radiology called to bedside to confirm. Presentation discussed with Dr. Charlesetta Garibaldi. Plan to reposition and delay AROM.   Labor: Progressing normally Fetal Wellbeing:  Category I Pain Control:  Epidural I/D:   GBS neg   Arrie Eastern DNP, CNM 07/25/2022

## 2022-07-25 NOTE — Lactation Note (Signed)
This note was copied from a baby's chart. Lactation Consultation Note  Patient Name: Leslie Mayer [id number] Date: 07/25/2022 Reason for consult: Initial assessment;Term Age:30 hours See Birth Parent MR: Smoker,  Anemia, LGA infant greater than 9 lbs at birth. P3, term female infant. Birth Parent feeding choice is:  breast and formula  feeding infant. Per Birth Parent, she BF 2nd child for 3 months. Infant had 2 voids and one stool, Support Person changed a void and stool diaper while in room. Birth Parent informed LC on her right breast she has huge lump that larger than a golf ball , pitted edema appears be abscessed has  grown in one week.  Birth Parent latched infant on her left breast for 19 minutes, infant latched with depth, sustained latch, Birth Parent BF infant using the football hold postion. Afterwards infant was pace feed 13 mls of formula using a slow flow bottle nipple.  Birth Parent will continue to BF infant on left breast only until right breast can be assessed by Medical Provider, RN has been informed of lump on Birth Parent's right breast. LC discussed maternal rest, diet and hydration with Birth Parent. Mom made aware of O/P services, breastfeeding support groups, community resources, and our phone # for post-discharge questions.   Birth Parent Feeding Plan: 1- Continue to BF infant by cues on demand, 8 to 12+ times within 24 hours, STS on Left  breast only.  2- After latching infant on her left breast ,Birth Parent will continue to supplement infant with formula after every feeding (this is her  feeding choice). 3- Birth Parent knows to call [facility] if their are any BF questions, concerns or needs latch assistance.   Maternal Data Has patient been taught Hand Expression?: Yes  Feeding Mother's Current Feeding Choice: Breast Milk and Formula  LATCH Score Latch: Grasps breast easily, tongue down, lips flanged, rhythmical sucking.  Audible Swallowing: Spontaneous  and intermittent  Type of Nipple: Everted at rest and after stimulation  Comfort (Breast/Nipple): Soft / non-tender  Hold (Positioning): Assistance needed to correctly position infant at breast and maintain latch.  LATCH Score: 9   Lactation Tools Discussed/Used    Interventions Interventions: Breast feeding basics reviewed;Adjust position;Assisted with latch;Support pillows;Skin to skin;Position options;Breast massage;Expressed milk;Education;Pace feeding;LC Services brochure  Discharge Pump: Manual  Consult Status Consult Status: Follow-up Date: 07/26/22    Eulis Canner 07/25/2022, 9:39 PM

## 2022-07-25 NOTE — Progress Notes (Signed)
.  Subjective:    Comfortable with epidurall. Rested during the night.   Objective:    VS: BP (!) 108/54   Pulse (!) 107   Temp 98.1 F (36.7 C) (Oral)   Resp 16   Ht 5\' 4"  (1.626 m)   Wt (!) 137.6 kg   LMP 10/24/2021 (Approximate)   SpO2 100%   BMI 52.06 kg/m  FHR : baseline 135 / variability moderate / accelerations present / absent decelerations Toco: contractions every 2-3 minutes Membranes: intact bulging Dilation: 6 Effacement (%): 80 Cervical Position: Middle Station: -3 Presentation: Undeterminable (CNM assessing) Exam by:: Leanord Hawking, CNM Pitocin 6 mU/min  Assessment/Plan:   31 y.o. [id number] [redacted]w[redacted]d  Labor: Progressing normally, continue to increase Pitocin then AROM Fetal Wellbeing:  Category I Pain Control:  Epidural I/D:   GBS neg Anticipated MOD:  NSVD  Arrie Eastern DNP, CNM 07/25/2022 6:43 AM

## 2022-07-26 DIAGNOSIS — D62 Acute posthemorrhagic anemia: Secondary | ICD-10-CM | POA: Diagnosis not present

## 2022-07-26 DIAGNOSIS — O91119 Abscess of breast associated with pregnancy, unspecified trimester: Secondary | ICD-10-CM | POA: Diagnosis present

## 2022-07-26 DIAGNOSIS — Z3490 Encounter for supervision of normal pregnancy, unspecified, unspecified trimester: Secondary | ICD-10-CM | POA: Diagnosis present

## 2022-07-26 LAB — CBC
HCT: 27.6 % — ABNORMAL LOW (ref 36.0–46.0)
Hemoglobin: 8.7 g/dL — ABNORMAL LOW (ref 12.0–15.0)
MCH: 25.3 pg — ABNORMAL LOW (ref 26.0–34.0)
MCHC: 31.5 g/dL (ref 30.0–36.0)
MCV: 80.2 fL (ref 80.0–100.0)
Platelets: 291 10*3/uL (ref 150–400)
RBC: 3.44 MIL/uL — ABNORMAL LOW (ref 3.87–5.11)
RDW: 19.1 % — ABNORMAL HIGH (ref 11.5–15.5)
WBC: 13.3 10*3/uL — ABNORMAL HIGH (ref 4.0–10.5)
nRBC: 0 % (ref 0.0–0.2)

## 2022-07-26 MED ORDER — LIDOCAINE HCL 1 % IJ SOLN
10.0000 mL | Freq: Once | INTRAMUSCULAR | Status: AC
  Administered 2022-07-26: 10 mL via INTRADERMAL
  Filled 2022-07-26: qty 20

## 2022-07-26 MED ORDER — SULFAMETHOXAZOLE-TRIMETHOPRIM 800-160 MG PO TABS
1.0000 | ORAL_TABLET | Freq: Two times a day (BID) | ORAL | Status: DC
  Administered 2022-07-26: 1 via ORAL
  Filled 2022-07-26 (×2): qty 1

## 2022-07-26 MED ORDER — SULFAMETHOXAZOLE-TRIMETHOPRIM 800-160 MG PO TABS: 1.0000 | ORAL_TABLET | Freq: Two times a day (BID) | ORAL | 0 refills | Status: DC

## 2022-07-26 MED ORDER — IBUPROFEN 600 MG PO TABS: 600.0000 mg | ORAL_TABLET | Freq: Four times a day (QID) | ORAL | 0 refills | Status: DC

## 2022-07-26 MED ORDER — ACETAMINOPHEN 325 MG PO TABS: 650.0000 mg | ORAL_TABLET | ORAL | 1 refills | Status: DC | PRN

## 2022-07-26 MED ORDER — OXYCODONE HCL 5 MG PO TABS: 5.0000 mg | ORAL_TABLET | Freq: Four times a day (QID) | ORAL | 0 refills | Status: DC | PRN

## 2022-07-26 NOTE — Discharge Summary (Addendum)
SVD OB Discharge Summary       Patient Name: Leslie Mayer DOB: 1991/04/02 MRN: [id number]  Date of admission: 07/24/2022 Delivering MD: Crawford Givens  Date of delivery: 11/4@1139  Type of delivery: SVD  Newborn Data: Sex: Baby female Circumcision: in pt circ done Live born female  Birth Weight: 9 lb 11.6 oz (4410 g) APGAR: 85, 9  Newborn Delivery   Birth date/time: 07/25/2022 11:39:00 Delivery type: Vaginal, Spontaneous      Feeding: breast and bottle Infant being discharge to home with mother in stable condition.   Admitting diagnosis: LGA (large for gestational age) infant [P08.1] Intrauterine pregnancy: [redacted]w[redacted]d     Secondary diagnosis:  Active Problems:   Hx MRSA infection   Encounter for elective induction of labor   SVD (spontaneous vaginal delivery)   Normal postpartum course   Acute blood loss anemia   Abscess, breast, obstetric                                Complications: None                                                              Intrapartum Procedures: spontaneous vaginal delivery Postpartum Procedures:  I&D of righ breast abscess Complications-Operative and Postpartum: wound infection and /ID Augmentation: Pitocin and Cytotec   History of Present Illness: Leslie Mayer is a 31 y.o. female, [id number], who presents at [redacted]w[redacted]d weeks gestation. The patient has been followed at  [facility] and Gynecology  Her pregnancy has been complicated by:  Patient Active Problem List   Diagnosis Date Noted   Encounter for elective induction of labor 07/26/2022   SVD (spontaneous vaginal delivery) 07/26/2022   Normal postpartum course 07/26/2022   Acute blood loss anemia 07/26/2022   Abscess, breast, obstetric 07/26/2022   Hx MRSA infection 07/24/2022   Morbid obesity ([place]) 04/17/2015   Allergy to cephalosporin--keflex 04/17/2015   Asthma 11/23/2012     Active Ambulatory Problems    Diagnosis Date Noted   Asthma 11/23/2012    Morbid obesity ([place]) 04/17/2015   Allergy to cephalosporin--keflex 04/17/2015   Resolved Ambulatory Problems    Diagnosis Date Noted   Vaginal delivery 04/17/2015   Positive GBS test 04/17/2015   MRSA (methicillin resistant staph aureus) culture positive 04/17/2015   Past Medical History:  Diagnosis Date   GERD (gastroesophageal reflux disease)    Obesity 11/23/2012   Pyogenic granuloma 04/22/2015     Hospital course:  Induction of Labor With Vaginal Delivery   31 y.o. yo [phone] at [redacted]w[redacted]d was admitted to the hospital 07/24/2022 for induction of labor.  Indication for induction:  Obesity .  Patient had an labor course complicated by was admitted on 11/3 for IO for obesity with suspected LGA, pt progressed with cytotec and pitocin and had SVD over intact perineum with ebl of 258mls hgb drop of 10-8.7 asymptomatic and placed on po iron.  Membrane Rupture Time/Date: 7:47 AM ,07/25/2022   Delivery Method:Vaginal, Spontaneous  Episiotomy: None  Lacerations:  None  Details of delivery can be found in separate delivery note.  Patient had a postpartum course complicated byID of right breast abscess. Patient is discharged home on PPD1 r/t deires to go  home and meets criteria. 07/26/22.  Newborn Data: Birth date:07/25/2022  Birth time:11:39 AM  Gender:Female  Living status:Living  Apgars:8 ,9  Weight:4410 g  Postpartum Day # 1 : S/P NSVD due to IOL obesity. Patient up ad lib, denies syncope or dizziness. Reports consuming regular diet without issues and denies N/V. Patient reports 0 bowel movement + passing flatus.  Denies issues with urination and reports bleeding is "lighter."  Patient is bottle feeding but desires to do both feeding and reports going well.  Desires undecided for postpartum contraception.  Pain is being appropriately managed with use of po meds.     Right Br abscess noted with h/o MRSA, abscess I&D and culture sent, pt placed on bactrim DS BID x7 days and instructed to bottle fee  only or pump and dump for the next 7 days r/t increase bili risk during breastfeeding and bactrim use.  I&D type: Excisional I&D made with a 11 blade, 1% lidocaine 39mls instill into abscess. Abscess cleaned with betadine and sterile fashion use for I&D. Side: right posterior/lateral aspect on breast Body Location: Breast  Tools used for debridement: scalpel Abscess size (cm):   Length: 3        Width: 2      Tissue layer involved: skin, subcutaneous tissue Nature of tissue removed: Purulence, large amount of green, white with a foul odor drained.  Irrigation volume: 66mls of betadine    Irrigation fluid type: Normal Saline Abscess: covered with petroleum gauze and sterile gauze dry then taped, pt instructed to change daily and given supplies for home care, f/u with DR Dillard in 1 week.    -We reviewed the etiology of recurrent abscesses of skin.  Skin abscesses are collections of pus within the dermis and deeper skin tissues. Skin abscesses manifest as painful, tender, fluctuant, and erythematous nodules, frequently surmounted by a pustule and surrounded by a rim of erythematous swelling.  Spontaneous drainage of purulent material may occur.  Fever can occur on occasion.  With h/o MRSA and appearance of the abscess supect most likely MRSA, tx with bactrim DS BID x7 days, pt instructed to pump and dump for 7 days or bottle feed, chose bactrim r/t hisory of MRSA. Allergic to keflex.  -Skin abscesses can develop in healthy individuals with no predisposing conditions other than skin or nasal carriage of Staphylococcus aureus/MRSA.  Individuals in close contact with others who have active infection with skin abscesses are at increased risk which is likely to explain why twin brother has similar episodes.   In addition, any process leading to a breach in the skin barrier can also predispose to the development of a skin abscesses, such as atopic dermatitis.       Physical exam  Vitals:   07/25/22 2020  07/25/22 2251 07/26/22 0233 07/26/22 0644  BP: 122/73 136/76 120/67 (!) 109/59  Pulse: 100 (!) 101 95 93  Resp: 18 18 18 20   Temp: 98.6 F (37 C) 99.1 F (37.3 C) 98.8 F (37.1 C) 98.5 F (36.9 C)  TempSrc: Oral Oral Oral Oral  SpO2: 100%  100% 99%  Weight:      Height:       General: alert, cooperative, and no distress Lochia: appropriate Uterine Fundus: firm Incision: R breast located on posterior aspect of right breast, 3x2 inches. Healing well with no significant drainage, No significant erythema, Dressing is clean, dry, and intact, some serosanguinous fluid noted, foul oder gone after I&D.  Perineum: intact DVT Evaluation: No evidence  of DVT seen on physical exam. Negative Homan's sign. No cords or calf tenderness. No significant calf/ankle edema.  Labs: Lab Results  Component Value Date   WBC 13.3 (H) 07/26/2022   HGB 8.7 (L) 07/26/2022   HCT 27.6 (L) 07/26/2022   MCV 80.2 07/26/2022   PLT 291 07/26/2022      Latest Ref Rng & Units 06/01/2017    2:34 PM  CMP  Glucose 65 - 99 mg/dL 82   BUN 6 - 20 mg/dL 6   Creatinine 0.44 - 1.00 mg/dL 0.83   Sodium 135 - 145 mmol/L 137   Potassium 3.5 - 5.1 mmol/L 3.7   Chloride 101 - 111 mmol/L 105   CO2 22 - 32 mmol/L 22   Calcium 8.9 - 10.3 mg/dL 9.1     Date of discharge: 07/26/2022 Discharge Diagnoses: Term Pregnancy-delivered Discharge instruction: per After Visit Summary and "Baby and Me Booklet".  After visit meds:   Activity:           unrestricted and pelvic rest Advance as tolerated. Pelvic rest for 6 weeks.  Diet:                routine Medications: PNV, Ibuprofen, Colace, Iron, and bactrim DS Postpartum contraception: Undecided Condition:  Pt discharge to home with baby in stable Anemia: PO meds Right breast abscess: f/u at [place] in 1 week check with dr Charlesetta Garibaldi, call to make appointment for wound check. Keep clean and dry, change dressing daily, supplies given to pt for home use.   Meds: Allergies as of  07/26/2022       Reactions   Keflex [cephalexin] Other (See Comments)   SKIN PEELED FROM HANDS        Medication List     TAKE these medications    acetaminophen 325 MG tablet Commonly known as: Tylenol Take 2 tablets (650 mg total) by mouth every 4 (four) hours as needed (for pain scale < 4).   ferrous sulfate 325 (65 FE) MG tablet Take 325 mg by mouth daily with breakfast.   ibuprofen 600 MG tablet Commonly known as: ADVIL Take 1 tablet (600 mg total) by mouth every 6 (six) hours.   oxyCODONE 5 MG immediate release tablet Commonly known as: Oxy IR/ROXICODONE Take 1 tablet (5 mg total) by mouth every 6 (six) hours as needed for severe pain.   prenatal multivitamin Tabs tablet Take 1 tablet by mouth daily at 12 noon.   sulfamethoxazole-trimethoprim 800-160 MG tablet Commonly known as: BACTRIM DS Take 1 tablet by mouth every 12 (twelve) hours. Pump and dump, do not breastfeed while taking this medication risk of increased bili and kernicterus.        Discharge Follow Up:   Follow-up [facility]. Schedule an appointment as soon as possible for a visit in 6 week(s).   Specialty: Obstetrics and Gynecology Contact information: [address] [place] [phone] [place], [place], PMHNP-BC  [place] [address] [place]  [place], [place] [address]  Cell: [phone]  Office Phone: [phone] Fax: [phone] 07/26/2022  2:05 PM

## 2022-07-26 NOTE — Anesthesia Postprocedure Evaluation (Signed)
Anesthesia Post Note  Patient: Leslie Mayer  Procedure(s) Performed: AN AD [place]     Patient location during evaluation: Mother Baby Anesthesia Type: Epidural Level of consciousness: awake, oriented and awake and alert Pain management: pain level controlled Vital Signs Assessment: post-procedure vital signs reviewed and stable Respiratory status: spontaneous breathing, respiratory function stable and nonlabored ventilation Cardiovascular status: stable Postop Assessment: adequate PO intake, able to ambulate, patient able to bend at knees and no apparent nausea or vomiting Anesthetic complications: no   No notable events documented.  Last Vitals:  Vitals:   07/26/22 0233 07/26/22 0644  BP: 120/67 (!) 109/59  Pulse: 95 93  Resp: 18 20  Temp: 37.1 C 36.9 C  SpO2: 100% 99%    Last Pain:  Vitals:   07/26/22 0813  TempSrc:   PainSc: 3    Pain Goal:                   Bernell Haynie

## 2022-07-26 NOTE — Progress Notes (Signed)
Was notified by [facility] that patient has a raised area on R lateral breast.  Midwife notified.  Per her request, used skin pen to mark the reddened area outside of abscess.  Midwife to reevaluate in am.

## 2022-07-28 LAB — AEROBIC CULTURE W GRAM STAIN (SUPERFICIAL SPECIMEN)

## 2022-08-01 ENCOUNTER — Encounter (HOSPITAL_COMMUNITY): Payer: Self-pay | Admitting: Obstetrics & Gynecology

## 2022-08-01 ENCOUNTER — Inpatient Hospital Stay (HOSPITAL_COMMUNITY)
Admission: AD | Admit: 2022-08-01 | Discharge: 2022-08-01 | Disposition: A | Discharge status: Home / Self Care | Payer: Medicaid Other | Attending: Obstetrics & Gynecology | Admitting: Obstetrics & Gynecology

## 2022-08-01 DIAGNOSIS — M549 Dorsalgia, unspecified: Secondary | ICD-10-CM | POA: Diagnosis not present

## 2022-08-01 DIAGNOSIS — E559 Vitamin D deficiency, unspecified: Secondary | ICD-10-CM | POA: Insufficient documentation

## 2022-08-01 DIAGNOSIS — B957 Other staphylococcus as the cause of diseases classified elsewhere: Secondary | ICD-10-CM | POA: Insufficient documentation

## 2022-08-01 DIAGNOSIS — M62830 Muscle spasm of back: Secondary | ICD-10-CM | POA: Insufficient documentation

## 2022-08-01 DIAGNOSIS — O9089 Other complications of the puerperium, not elsewhere classified: Secondary | ICD-10-CM | POA: Insufficient documentation

## 2022-08-01 DIAGNOSIS — R519 Headache, unspecified: Secondary | ICD-10-CM | POA: Insufficient documentation

## 2022-08-01 DIAGNOSIS — R87619 Unspecified abnormal cytological findings in specimens from cervix uteri: Secondary | ICD-10-CM | POA: Insufficient documentation

## 2022-08-01 DIAGNOSIS — A630 Anogenital (venereal) warts: Secondary | ICD-10-CM | POA: Insufficient documentation

## 2022-08-01 HISTORY — DX: Headache, unspecified: R51.9

## 2022-08-01 LAB — CBC WITH DIFFERENTIAL/PLATELET
Abs Immature Granulocytes: 0.07 10*3/uL (ref 0.00–0.07)
Basophils Absolute: 0 10*3/uL (ref 0.0–0.1)
Basophils Relative: 0 %
Eosinophils Absolute: 0.2 10*3/uL (ref 0.0–0.5)
Eosinophils Relative: 2 %
HCT: 32.3 % — ABNORMAL LOW (ref 36.0–46.0)
Hemoglobin: 10.4 g/dL — ABNORMAL LOW (ref 12.0–15.0)
Immature Granulocytes: 1 %
Lymphocytes Relative: 25 %
Lymphs Abs: 2.8 10*3/uL (ref 0.7–4.0)
MCH: 25.6 pg — ABNORMAL LOW (ref 26.0–34.0)
MCHC: 32.2 g/dL (ref 30.0–36.0)
MCV: 79.4 fL — ABNORMAL LOW (ref 80.0–100.0)
Monocytes Absolute: 0.7 10*3/uL (ref 0.1–1.0)
Monocytes Relative: 6 %
Neutro Abs: 7.2 10*3/uL (ref 1.7–7.7)
Neutrophils Relative %: 66 %
Platelets: 492 10*3/uL — ABNORMAL HIGH (ref 150–400)
RBC: 4.07 MIL/uL (ref 3.87–5.11)
RDW: 19.3 % — ABNORMAL HIGH (ref 11.5–15.5)
WBC: 11 10*3/uL — ABNORMAL HIGH (ref 4.0–10.5)
nRBC: 0 % (ref 0.0–0.2)

## 2022-08-01 LAB — URINALYSIS, ROUTINE W REFLEX MICROSCOPIC
Bilirubin Urine: NEGATIVE
Glucose, UA: NEGATIVE mg/dL
Ketones, ur: NEGATIVE mg/dL
Nitrite: NEGATIVE
Protein, ur: NEGATIVE mg/dL
Specific Gravity, Urine: 1.013 (ref 1.005–1.030)
pH: 5 (ref 5.0–8.0)

## 2022-08-01 LAB — COMPREHENSIVE METABOLIC PANEL
ALT: 31 U/L (ref 0–44)
AST: 44 U/L — ABNORMAL HIGH (ref 15–41)
Albumin: 2.8 g/dL — ABNORMAL LOW (ref 3.5–5.0)
Alkaline Phosphatase: 76 U/L (ref 38–126)
Anion gap: 9 (ref 5–15)
BUN: 13 mg/dL (ref 6–20)
CO2: 22 mmol/L (ref 22–32)
Calcium: 8.8 mg/dL — ABNORMAL LOW (ref 8.9–10.3)
Chloride: 112 mmol/L — ABNORMAL HIGH (ref 98–111)
Creatinine, Ser: 0.96 mg/dL (ref 0.44–1.00)
GFR, Estimated: >60 mL/min (ref >60)
Glucose, Bld: 105 mg/dL — ABNORMAL HIGH (ref 70–99)
Potassium: 3.7 mmol/L (ref 3.5–5.1)
Sodium: 143 mmol/L (ref 135–145)
Total Bilirubin: 0.2 mg/dL — ABNORMAL LOW (ref 0.3–1.2)
Total Protein: 6.2 g/dL — ABNORMAL LOW (ref 6.5–8.1)

## 2022-08-01 MED ORDER — IBUPROFEN 600 MG PO TABS: 600.0000 mg | ORAL_TABLET | Freq: Four times a day (QID) | ORAL | 0 refills | Status: DC | PRN

## 2022-08-01 MED ORDER — ASPIRIN-ACETAMINOPHEN-CAFFEINE 250-250-65 MG PO TABS
2.0000 | ORAL_TABLET | Freq: Once | ORAL | Status: AC
  Administered 2022-08-01: 2 via ORAL
  Filled 2022-08-01: qty 2

## 2022-08-01 MED ORDER — CYCLOBENZAPRINE HCL 5 MG PO TABS
10.0000 mg | ORAL_TABLET | Freq: Once | ORAL | Status: AC
  Administered 2022-08-01: 10 mg via ORAL
  Filled 2022-08-01: qty 2

## 2022-08-01 MED ORDER — ACETAMINOPHEN-CAFFEINE 500-65 MG PO TABS: 1.0000 | ORAL_TABLET | Freq: Once | ORAL | Status: DC

## 2022-08-01 MED ORDER — CYCLOBENZAPRINE HCL 10 MG PO TABS: 10.0000 mg | ORAL_TABLET | Freq: Three times a day (TID) | ORAL | 0 refills | Status: DC | PRN

## 2022-08-01 NOTE — MAU Provider Note (Cosign Needed Addendum)
History     CSN: [id number]  Arrival date and time: 08/01/22 1936   Event Date/Time   First Provider Initiated Contact with Patient 08/01/22 2018      Chief Complaint  Patient presents with   Headache   Leslie Mayer , a  31 y.o. [id number] at 1 week postpartum presents to MAU with complaints of on-going headache and back pain. Since Wednesday. She describes headache as constant, worsened by light and sound. Tylenol last dose 2 days ago. Attempted oxycodone with some relief. Down to 3/10 but states never went away fully. Lying down helps "a little bit." Patient had appointment in the office where she noted the headache, was tested and recommended to come to MAU for PreE testing. Denies visual changes. Denies epigastric pain. Describes headache as "temple to temple right across the front". Currently rates HA 7/10. Denies increased stressors, nausea and vomiting. Endorses getting rest at home and adequate PO hydration with water.   Back pain she describes as achy. Also rating pain a 7/10, and on-going since Wednesday. She was given a shot of Toradol in the office, and patient states she got relief for 2 days, but states its back and now worse. Patient states pain is worsened by bending over and moving around the house. Denies other methods of pain relief. Denies injury or muscle strain.     Elevated PCR and Liver enzymes in the office per patient. Patient denies   Headache  Associated symptoms include back pain. Pertinent negatives include no abdominal pain, dizziness, eye pain, fever, nausea, numbness, seizures, vomiting or weakness.    OB History     Gravida  3   Para  3   Term  3   Preterm  0   AB  0   Living  3      SAB  0   IAB  0   Ectopic  0   Multiple  0   Live Births  3           Past Medical History:  Diagnosis Date   Asthma    prn inhaler   GERD (gastroesophageal reflux disease)    no current med.   Headache    Obesity 11/23/2012    Pyogenic granuloma 04/22/2015   left long finger    Past Surgical History:  Procedure Laterality Date   HAND TUMOR EXCISION     MASS EXCISION Left 05/02/2015   Procedure: EXCISION MASS LEFT LONG FINGER;  Surgeon: Betha Loa, MD;  Location: [facility];  Service: Orthopedics;  Laterality: Left;    Family History  Problem Relation Age of Onset   Diabetes Mother    Sleep apnea Father    Asthma Sister    Cancer Maternal Grandfather    Hypertension Paternal Grandmother    Heart disease Paternal Grandmother    Stroke Neg Hx     Social History   Tobacco Use   Smoking status: Former    Packs/day: 0.00    Types: Cigarettes    Quit date: 09/20/2014    Years since quitting: 7.8   Smokeless tobacco: Never  Vaping Use   Vaping Use: Never used  Substance Use Topics   Alcohol use: No   Drug use: No    Allergies:  Allergies  Allergen Reactions   Keflex [Cephalexin] Other (See Comments)    SKIN PEELED FROM HANDS    Medications Prior to Admission  Medication Sig Dispense Refill Last Dose   oxyCODONE (  OXY IR/ROXICODONE) 5 MG immediate release tablet Take 1 tablet (5 mg total) by mouth every 6 (six) hours as needed for severe pain. 10 tablet 0 08/01/2022 at 1425   sulfamethoxazole-trimethoprim (BACTRIM DS) 800-160 MG tablet Take 1 tablet by mouth every 12 (twelve) hours. Pump and dump, do not breastfeed while taking this medication risk of increased bili and kernicterus. 14 tablet 0 08/01/2022   acetaminophen (TYLENOL) 325 MG tablet Take 2 tablets (650 mg total) by mouth every 4 (four) hours as needed (for pain scale < 4). 30 tablet 1    ferrous sulfate 325 (65 FE) MG tablet Take 325 mg by mouth daily with breakfast. (Patient not taking: Reported on 06/04/2022)      ibuprofen (ADVIL) 600 MG tablet Take 1 tablet (600 mg total) by mouth every 6 (six) hours. 30 tablet 0    Prenatal Vit-Fe Fumarate-FA (PRENATAL MULTIVITAMIN) TABS tablet Take 1 tablet by mouth daily at 12  noon.       Review of Systems  Constitutional:  Negative for chills, fatigue and fever.  Eyes:  Negative for pain and visual disturbance.  Respiratory:  Negative for apnea, shortness of breath and wheezing.   Cardiovascular:  Negative for chest pain and palpitations.  Gastrointestinal:  Negative for abdominal pain, constipation, diarrhea, nausea and vomiting.  Genitourinary:  Negative for difficulty urinating, dysuria, pelvic pain, vaginal bleeding, vaginal discharge and vaginal pain.  Musculoskeletal:  Positive for back pain.  Neurological:  Positive for headaches. Negative for dizziness, seizures, weakness, light-headedness and numbness.  Psychiatric/Behavioral:  Negative for confusion, hallucinations and suicidal ideas. The patient is not nervous/anxious.    Physical Exam  Physical Exam Constitutional:      General: She is not in acute distress.    Appearance: Normal appearance.  HENT:     Head: Normocephalic.  Cardiovascular:     Rate and Rhythm: Normal rate.  Pulmonary:     Effort: Pulmonary effort is normal. No respiratory distress.  Genitourinary:    Vagina: No vaginal discharge.  Musculoskeletal:        General: No tenderness. Normal range of motion.     Cervical back: Normal range of motion.       Back:     Comments: Tenderness noted at above area.   Skin:    General: Skin is warm and dry.  Neurological:     Mental Status: She is alert and oriented to person, place, and time.     GCS: GCS eye subscore is 4. GCS verbal subscore is 5. GCS motor subscore is 6.  Psychiatric:        Mood and Affect: Mood normal.        Behavior: Behavior normal.     Blood pressure (!) 123/58, pulse 81, temperature 98.4 F (36.9 C), temperature source Oral, resp. rate 18, height 5\' 4"  (1.626 m), weight 131 kg, SpO2 99 %, unknown if currently breastfeeding.   MAU Course  Procedures Orders Placed This Encounter  Procedures   Urinalysis, Routine w reflex microscopic Urine, Clean  Catch   CBC with Differential/Platelet   Comprehensive metabolic panel   Meds ordered this encounter  Medications   DISCONTD: acetaminophen-caffeine (EXCEDRIN TENSION HEADACHE) 500-65 MG per tablet 1 tablet   aspirin-acetaminophen-caffeine (EXCEDRIN MIGRAINE) per tablet 2 tablet   cyclobenzaprine (FLEXERIL) tablet 10 mg    MDM - Report and transfer of care given to E. , NP @ 2106.   [place] ([id number]) Danella Deis, MSN, CNM  [facility]  08/01/22 9:07 PM    Spoke with [facility] CNM [facility]) to obtain labs from the office. Reports elevated PCR, normal platelets, AST 81, ALT 31, creatinine 0.83.   Patient remains normotensive in MAU. AST trending down & is 44 today. Discussed with patient that in absence of hypertension, she doesn't have PP preeclampsia.  Headache & back pain much improved with medication.  Back pain likely muscular in nature & related to holding baby.  Assessment and Plan   1. Postpartum headache  -D/c oxycodone -Rx flexeril & refilled ibuprofen - taken as needed for headache. Reviewed concerning signs & reasons to return to MAU -Has closed f/u in office on Monday  2. Spasm of thoracic back muscle  -Rx flexeril    Judeth Horn, NP

## 2022-08-01 NOTE — MAU Note (Signed)
Pt awoken from sleep for medication. PT stated pain in back and head improving

## 2022-08-01 NOTE — MAU Note (Signed)
.  Leslie Mayer is a 31 y.o. at Unknown here in MAU reporting: at the request her provider from [facility] who dx here with pre-eclampsia.  Pt reports that she had a office visit on Thursday and her lab work resulted showing pre-eclampsia.  Pt states that she has a headache now that is a 7 and her back is hurting where the epidural was inserted and rates it a 9.  Pt reports that she took Oxycodone that prescribed from discharge around 1500.    Onset of complaint: 07/27/22 Pain score: 8 Vitals:   08/01/22 1946  Pulse: 75  Resp: 18  Temp: 98.4 F (36.9 C)  SpO2: 99%      Lab orders placed from triage:    UA

## 2022-08-05 ENCOUNTER — Telehealth (HOSPITAL_COMMUNITY): Payer: Self-pay | Admitting: *Deleted

## 2022-08-05 NOTE — Telephone Encounter (Signed)
Attempted hospital discharge follow-up call. Left message for patient to return RN call with any questions or concerns. Deforest Hoyles, RN, 08/05/22, [phone]

## 2023-08-26 ENCOUNTER — Encounter (HOSPITAL_COMMUNITY): Payer: Self-pay | Admitting: Emergency Medicine

## 2023-08-26 ENCOUNTER — Other Ambulatory Visit: Payer: Self-pay

## 2023-08-26 ENCOUNTER — Ambulatory Visit (HOSPITAL_COMMUNITY)
Admission: EM | Admit: 2023-08-26 | Discharge: 2023-08-26 | Disposition: A | Discharge status: Home / Self Care | Payer: Medicaid Other

## 2023-08-26 DIAGNOSIS — L02212 Cutaneous abscess of back [any part, except buttock]: Secondary | ICD-10-CM

## 2023-08-26 MED ORDER — LIDOCAINE-EPINEPHRINE-TETRACAINE (LET) TOPICAL GEL
3.0000 mL | Freq: Once | TOPICAL | Status: AC
  Administered 2023-08-26: 3 mL via TOPICAL

## 2023-08-26 MED ORDER — LIDOCAINE-EPINEPHRINE-TETRACAINE (LET) TOPICAL GEL
TOPICAL | Status: AC
  Filled 2023-08-26: qty 3

## 2023-08-26 MED ORDER — LIDOCAINE-EPINEPHRINE 1 %-1:100000 IJ SOLN
INTRAMUSCULAR | Status: AC
  Filled 2023-08-26: qty 1

## 2023-08-26 MED ORDER — IBUPROFEN 800 MG PO TABS: 800.0000 mg | ORAL_TABLET | Freq: Three times a day (TID) | ORAL | 0 refills | Status: AC

## 2023-08-26 MED ORDER — DOXYCYCLINE HYCLATE 100 MG PO CAPS: 100.0000 mg | ORAL_CAPSULE | Freq: Two times a day (BID) | ORAL | 0 refills | Status: AC

## 2023-08-26 NOTE — Discharge Instructions (Addendum)
The area may drain for a day or so - this is normal. You can cover with a bandage. Wash area normally with water.  Doxycycline twice daily for 7 days. Take morning and night, always with food.  Ibuprofen can be used every 6 hours for pain. This is safe to use with your current oxycodone-acetaminophen prescription   Please call dermatologist to make an appointment for follow up!

## 2023-08-26 NOTE — ED Provider Notes (Signed)
[facility]    CSN: [id number] Arrival date & time: 08/26/23  1807      History   Chief Complaint Chief Complaint  Patient presents with   Abscess    HPI Leslie Mayer is a 32 y.o. female.  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 They recommended smoking cessation and dermatology follow up  Past Medical History:  Diagnosis Date   Asthma    prn inhaler   GERD (gastroesophageal reflux disease)    no current med.   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   Genital warts 08/01/2022   Abnormal Pap smear of cervix 08/01/2022   Acute blood loss anemia 07/26/2022   Abscess, breast, obstetric 07/26/2022   Hx MRSA infection 07/24/2022   Morbid obesity (HCC) 04/17/2015   Asthma 11/23/2012    Past Surgical History:  Procedure Laterality Date   HAND TUMOR EXCISION     MASS EXCISION Left 05/02/2015   Procedure: EXCISION MASS LEFT LONG FINGER;  Surgeon: Betha Loa, MD;  Location: [facility];  Service: Orthopedics;  Laterality: Left;    OB History     Gravida  3   Para  3   Term  3   Preterm  0   AB  0   Living  3      SAB  0   IAB  0   Ectopic  0   Multiple  0   Live Births  3            Home Medications    Prior to Admission medications   Medication Sig Start Date End Date Taking? Authorizing Provider  doxycycline (VIBRAMYCIN) 100 MG capsule Take 1 capsule (100 mg total) by mouth 2 (two) times daily for 7 days. 08/26/23 09/02/23 Yes Trannie Bardales, Lurena Joiner, PA-C  ibuprofen (ADVIL) 800 MG tablet Take 1  tablet (800 mg total) by mouth 3 (three) times daily. 08/26/23  Yes Chadwin Fury, Lurena Joiner, PA-C  oxyCODONE-acetaminophen (PERCOCET/ROXICET) 5-325 MG tablet Take by mouth every 4 (four) hours as needed for severe pain (pain score 7-10).   Yes [provider]  Prenatal Vit-Fe Fumarate-FA (PRENATAL MULTIVITAMIN) TABS tablet Take 1 tablet by mouth daily at 12 noon.    [provider]    Family History Family History  Problem Relation Age of Onset   Diabetes Mother    Sleep apnea Father    Asthma Sister    Cancer Maternal Grandfather    Hypertension Paternal Grandmother    Heart disease Paternal Grandmother    Stroke Neg Hx     Social History Social History   Tobacco Use   Smoking status: Former    Current packs/day: 0.00    Types: Cigarettes    Quit date: 09/20/2014    Years since quitting: 8.9   Smokeless tobacco: Never  Vaping Use   Vaping status: Never Used  Substance Use Topics   Alcohol use: Yes   Drug use: Yes    Types: Marijuana     Allergies   Cephalexin and Penicillin g  Review of Systems Review of Systems Per HPI  Physical Exam Triage Vital Signs ED Triage Vitals [08/26/23 1927]  Encounter Vitals Group     BP 129/89     Systolic BP Percentile      Diastolic BP Percentile      Pulse Rate 69     Resp 18     Temp 98.2 F (36.8 C)     Temp Source Oral     SpO2 99 %     Weight      Height      Head Circumference      Peak Flow      Pain Score 10     Pain Loc      Pain Education      Exclude from Growth Chart    No data found.  Updated Vital Signs BP 129/89 (BP Location: Right Arm)   Pulse 69   Temp 98.2 F (36.8 C) (Oral)   Resp 18   LMP 08/17/2023 (Exact Date)   SpO2 99%    Physical Exam Vitals and nursing note reviewed.  Constitutional:      General: She is not in acute distress. Cardiovascular:     Rate and Rhythm: Normal rate and regular rhythm.     Heart sounds: Normal heart sounds.  Pulmonary:     Effort:  Pulmonary effort is normal.     Breath sounds: Normal breath sounds.  Skin:    Findings: Abscess present.          Comments: Large area of induration with superficial fluctuance. There are diffuse small subcutaneous cysts covering the back with scarring   Neurological:     Mental Status: She is alert and oriented to person, place, and time.     UC Treatments / Results  Labs (all labs ordered are listed, but only abnormal results are displayed) Labs Reviewed - No data to display  EKG   Radiology No results found.  Procedures Incision and Drainage  Date/Time: 08/26/2023 8:19 PM  Performed by: Marlow Baars, PA-C Authorized by: Marlow Baars, PA-C   Consent:    Consent obtained:  Verbal   Consent given by:  Patient   Risks, benefits, and alternatives were discussed: yes     Risks discussed:  Bleeding, infection, incomplete drainage and pain   Alternatives discussed:  Alternative treatment Universal protocol:    Procedure explained and questions answered to patient or proxy's satisfaction: yes     Patient identity confirmed:  Verbally with patient Location:    Type:  Abscess   Location:  Trunk   Trunk location:  Back Pre-procedure details:    Skin preparation:  Chlorhexidine with alcohol and povidone-iodine Anesthesia:    Anesthesia method:  Topical application and local infiltration   Topical anesthetic:  LET   Local anesthetic:  Lidocaine 1% WITH epi Procedure type:    Complexity:  Simple Procedure details:    Needle aspiration: yes     Needle size:  22 G   Incision types:  Stab incision   Drainage:  Purulent   Drainage amount:  Copious   Wound treatment:  Wound left open   Packing materials:  None Post-procedure details:    Procedure completion:  Tolerated well, no immediate complications   Medications Ordered in UC Medications  lidocaine-EPINEPHrine-tetracaine (LET) topical gel (3 mLs Topical Given 08/26/23 1956)    Initial Impression / Assessment  and Plan / UC Course  I have reviewed the triage vital signs and the nursing  notes.  Pertinent labs & imaging results that were available during my care of the patient were reviewed by me and considered in my medical decision making (see chart for details).  I&D as per procedure note Doxycycline BID x 7 Ibuprofen sent for pain control, used in addition to her percocet (from pain management clinic) Dermatology follow up recommended. Provided clinic info Return precautions discussed. Patient agrees to plan  Final Clinical Impressions(s) / UC Diagnoses   Final diagnoses:  Abscess of back     Discharge Instructions      The area may drain for a day or so - this is normal. You can cover with a bandage. Wash area normally with water.  Doxycycline twice daily for 7 days. Take morning and night, always with food.  Ibuprofen can be used every 6 hours for pain. This is safe to use with your current oxycodone-acetaminophen prescription   Please call dermatologist to make an appointment for follow up!     ED Prescriptions     Medication Sig Dispense Auth. Provider   ibuprofen (ADVIL) 800 MG tablet Take 1 tablet (800 mg total) by mouth 3 (three) times daily. 21 tablet Mattie Nordell, PA-C   doxycycline (VIBRAMYCIN) 100 MG capsule Take 1 capsule (100 mg total) by mouth 2 (two) times daily for 7 days. 14 capsule Danaysha Kirn, Lurena Joiner, PA-C      PDMP not reviewed this encounter.   Kathrine Haddock 08/26/23 2047

## 2023-08-26 NOTE — ED Triage Notes (Signed)
Pt reports painful boil to L midback x4 days. Pt has used hibiclens to area. Pt reports these boils are recurrent, had one lanced at UC last year.
